# Patient Record
Sex: Female | Born: 1965 | Race: Black or African American | Hispanic: No | Marital: Single | Smoking: Current every day smoker
Health system: Southern US, Community
[De-identification: ages and names within clinical notes are randomized; demographics above are authoritative.]

## PROBLEM LIST (undated history)

## (undated) DIAGNOSIS — I10 Essential (primary) hypertension: Secondary | ICD-10-CM

## (undated) DIAGNOSIS — K589 Irritable bowel syndrome without diarrhea: Secondary | ICD-10-CM

## (undated) DIAGNOSIS — N920 Excessive and frequent menstruation with regular cycle: Secondary | ICD-10-CM

## (undated) DIAGNOSIS — F8081 Childhood onset fluency disorder: Secondary | ICD-10-CM

## (undated) DIAGNOSIS — F17200 Nicotine dependence, unspecified, uncomplicated: Secondary | ICD-10-CM

## (undated) DIAGNOSIS — N946 Dysmenorrhea, unspecified: Secondary | ICD-10-CM

## (undated) DIAGNOSIS — Z8619 Personal history of other infectious and parasitic diseases: Secondary | ICD-10-CM

## (undated) DIAGNOSIS — M1990 Unspecified osteoarthritis, unspecified site: Secondary | ICD-10-CM

## (undated) HISTORY — PX: ENDOMETRIAL ABLATION: SHX621

## (undated) HISTORY — DX: Unspecified osteoarthritis, unspecified site: M19.90

## (undated) HISTORY — PX: DILATION AND CURETTAGE OF UTERUS: SHX78

## (undated) HISTORY — DX: Childhood onset fluency disorder: F80.81

## (undated) HISTORY — PX: TUBAL LIGATION: SHX77

## (undated) HISTORY — DX: Personal history of other infectious and parasitic diseases: Z86.19

## (undated) HISTORY — PX: APPENDECTOMY: SHX54

## (undated) HISTORY — PX: OTHER SURGICAL HISTORY: SHX169

## (undated) HISTORY — DX: Irritable bowel syndrome, unspecified: K58.9

## (undated) HISTORY — DX: Dysmenorrhea, unspecified: N94.6

## (undated) HISTORY — DX: Excessive and frequent menstruation with regular cycle: N92.0

## (undated) HISTORY — DX: Nicotine dependence, unspecified, uncomplicated: F17.200

---

## 1998-05-23 ENCOUNTER — Other Ambulatory Visit: Admission: RE | Admit: 1998-05-23 | Discharge: 1998-05-23 | Payer: Self-pay | Admitting: Obstetrics

## 2000-07-02 ENCOUNTER — Ambulatory Visit (HOSPITAL_COMMUNITY): Admission: RE | Admit: 2000-07-02 | Discharge: 2000-07-02 | Payer: Self-pay | Admitting: Family Medicine

## 2001-08-31 ENCOUNTER — Encounter: Payer: Self-pay | Admitting: Family Medicine

## 2001-08-31 ENCOUNTER — Encounter: Admission: RE | Admit: 2001-08-31 | Discharge: 2001-08-31 | Payer: Self-pay | Admitting: Family Medicine

## 2002-09-01 ENCOUNTER — Encounter: Admission: RE | Admit: 2002-09-01 | Discharge: 2002-09-01 | Payer: Self-pay | Admitting: Family Medicine

## 2002-09-01 ENCOUNTER — Encounter: Payer: Self-pay | Admitting: Family Medicine

## 2003-09-18 ENCOUNTER — Other Ambulatory Visit: Admission: RE | Admit: 2003-09-18 | Discharge: 2003-09-18 | Payer: Self-pay | Admitting: Family Medicine

## 2003-09-29 ENCOUNTER — Emergency Department (HOSPITAL_COMMUNITY): Admission: EM | Admit: 2003-09-29 | Discharge: 2003-09-29 | Payer: Self-pay | Admitting: Emergency Medicine

## 2004-09-18 ENCOUNTER — Encounter: Admission: RE | Admit: 2004-09-18 | Discharge: 2004-09-18 | Payer: Self-pay | Admitting: Family Medicine

## 2004-09-18 ENCOUNTER — Other Ambulatory Visit: Admission: RE | Admit: 2004-09-18 | Discharge: 2004-09-18 | Payer: Self-pay | Admitting: Family Medicine

## 2004-11-21 ENCOUNTER — Encounter: Admission: RE | Admit: 2004-11-21 | Discharge: 2004-11-21 | Payer: Self-pay | Admitting: Occupational Medicine

## 2005-09-19 ENCOUNTER — Other Ambulatory Visit: Admission: RE | Admit: 2005-09-19 | Discharge: 2005-09-19 | Payer: Self-pay | Admitting: Family Medicine

## 2006-10-02 ENCOUNTER — Ambulatory Visit (HOSPITAL_COMMUNITY): Admission: RE | Admit: 2006-10-02 | Discharge: 2006-10-02 | Payer: Self-pay | Admitting: Family Medicine

## 2010-04-04 ENCOUNTER — Inpatient Hospital Stay (HOSPITAL_COMMUNITY): Admission: EM | Admit: 2010-04-04 | Discharge: 2010-04-05 | Payer: Self-pay | Source: Home / Self Care

## 2010-04-05 ENCOUNTER — Encounter (INDEPENDENT_AMBULATORY_CARE_PROVIDER_SITE_OTHER): Payer: Self-pay | Admitting: Internal Medicine

## 2010-06-17 LAB — CBC
HCT: 34.8 % — ABNORMAL LOW (ref 36.0–46.0)
HCT: 36.4 % (ref 36.0–46.0)
HCT: 36.7 % (ref 36.0–46.0)
HCT: 38.9 % (ref 36.0–46.0)
Hemoglobin: 12.2 g/dL (ref 12.0–15.0)
Hemoglobin: 12.5 g/dL (ref 12.0–15.0)
Hemoglobin: 12.7 g/dL (ref 12.0–15.0)
Hemoglobin: 13.4 g/dL (ref 12.0–15.0)
MCH: 31.3 pg (ref 26.0–34.0)
MCH: 31.5 pg (ref 26.0–34.0)
MCH: 31.5 pg (ref 26.0–34.0)
MCH: 31.9 pg (ref 26.0–34.0)
MCHC: 34.3 g/dL (ref 30.0–36.0)
MCHC: 34.4 g/dL (ref 30.0–36.0)
MCHC: 34.6 g/dL (ref 30.0–36.0)
MCHC: 35.1 g/dL (ref 30.0–36.0)
MCV: 91.1 fL (ref 78.0–100.0)
MCV: 91.1 fL (ref 78.0–100.0)
MCV: 91.2 fL (ref 78.0–100.0)
MCV: 91.3 fL (ref 78.0–100.0)
Platelets: 167 10*3/uL (ref 150–400)
Platelets: 169 10*3/uL (ref 150–400)
Platelets: 171 10*3/uL (ref 150–400)
Platelets: 173 10*3/uL (ref 150–400)
RBC: 3.82 MIL/uL — ABNORMAL LOW (ref 3.87–5.11)
RBC: 3.99 MIL/uL (ref 3.87–5.11)
RBC: 4.03 MIL/uL (ref 3.87–5.11)
RBC: 4.26 MIL/uL (ref 3.87–5.11)
RDW: 12.6 % (ref 11.5–15.5)
RDW: 12.6 % (ref 11.5–15.5)
RDW: 12.7 % (ref 11.5–15.5)
RDW: 12.7 % (ref 11.5–15.5)
WBC: 5 10*3/uL (ref 4.0–10.5)
WBC: 5.1 10*3/uL (ref 4.0–10.5)
WBC: 5.1 10*3/uL (ref 4.0–10.5)
WBC: 5.5 10*3/uL (ref 4.0–10.5)

## 2010-06-17 LAB — DIFFERENTIAL
Basophils Absolute: 0 10*3/uL (ref 0.0–0.1)
Basophils Absolute: 0 10*3/uL (ref 0.0–0.1)
Basophils Absolute: 0 10*3/uL (ref 0.0–0.1)
Basophils Absolute: 0 10*3/uL (ref 0.0–0.1)
Basophils Relative: 0 % (ref 0–1)
Basophils Relative: 1 % (ref 0–1)
Basophils Relative: 1 % (ref 0–1)
Basophils Relative: 1 % (ref 0–1)
Eosinophils Absolute: 0 10*3/uL (ref 0.0–0.7)
Eosinophils Absolute: 0.1 10*3/uL (ref 0.0–0.7)
Eosinophils Absolute: 0.1 10*3/uL (ref 0.0–0.7)
Eosinophils Absolute: 0.1 10*3/uL (ref 0.0–0.7)
Eosinophils Relative: 1 % (ref 0–5)
Eosinophils Relative: 1 % (ref 0–5)
Eosinophils Relative: 1 % (ref 0–5)
Eosinophils Relative: 2 % (ref 0–5)
Lymphocytes Relative: 33 % (ref 12–46)
Lymphocytes Relative: 35 % (ref 12–46)
Lymphocytes Relative: 35 % (ref 12–46)
Lymphocytes Relative: 36 % (ref 12–46)
Lymphs Abs: 1.7 10*3/uL (ref 0.7–4.0)
Lymphs Abs: 1.7 10*3/uL (ref 0.7–4.0)
Lymphs Abs: 1.8 10*3/uL (ref 0.7–4.0)
Lymphs Abs: 1.9 10*3/uL (ref 0.7–4.0)
Monocytes Absolute: 0.3 10*3/uL (ref 0.1–1.0)
Monocytes Absolute: 0.3 10*3/uL (ref 0.1–1.0)
Monocytes Absolute: 0.4 10*3/uL (ref 0.1–1.0)
Monocytes Absolute: 0.4 10*3/uL (ref 0.1–1.0)
Monocytes Relative: 6 % (ref 3–12)
Monocytes Relative: 7 % (ref 3–12)
Monocytes Relative: 7 % (ref 3–12)
Monocytes Relative: 7 % (ref 3–12)
Neutro Abs: 2.8 10*3/uL (ref 1.7–7.7)
Neutro Abs: 2.9 10*3/uL (ref 1.7–7.7)
Neutro Abs: 3 10*3/uL (ref 1.7–7.7)
Neutro Abs: 3.1 10*3/uL (ref 1.7–7.7)
Neutrophils Relative %: 56 % (ref 43–77)
Neutrophils Relative %: 56 % (ref 43–77)
Neutrophils Relative %: 58 % (ref 43–77)
Neutrophils Relative %: 59 % (ref 43–77)

## 2010-06-17 LAB — MRSA PCR SCREENING: MRSA by PCR: NEGATIVE

## 2010-06-17 LAB — COMPREHENSIVE METABOLIC PANEL
ALT: 21 U/L (ref 0–35)
AST: 24 U/L (ref 0–37)
Albumin: 3.6 g/dL (ref 3.5–5.2)
Alkaline Phosphatase: 51 U/L (ref 39–117)
BUN: 6 mg/dL (ref 6–23)
CO2: 26 mEq/L (ref 19–32)
Calcium: 8.8 mg/dL (ref 8.4–10.5)
Chloride: 108 mEq/L (ref 96–112)
Creatinine, Ser: 0.72 mg/dL (ref 0.4–1.2)
GFR calc Af Amer: >60 mL/min (ref >60)
GFR calc non Af Amer: >60 mL/min (ref >60)
Glucose, Bld: 90 mg/dL (ref 70–99)
Potassium: 3.8 mEq/L (ref 3.5–5.1)
Sodium: 139 mEq/L (ref 135–145)
Total Bilirubin: 0.3 mg/dL (ref 0.3–1.2)
Total Protein: 6 g/dL (ref 6.0–8.3)

## 2010-06-17 LAB — APTT: aPTT: 30 seconds (ref 24–37)

## 2010-06-17 LAB — BRAIN NATRIURETIC PEPTIDE: Pro B Natriuretic peptide (BNP): 37.4 pg/mL (ref 0.0–100.0)

## 2010-06-17 LAB — TYPE AND SCREEN
ABO/RH(D): A POS
Antibody Screen: NEGATIVE

## 2010-06-17 LAB — PROTIME-INR
INR: 1.05 (ref 0.00–1.49)
Prothrombin Time: 13.9 seconds (ref 11.6–15.2)

## 2010-06-17 LAB — TSH: TSH: 1.969 u[IU]/mL (ref 0.350–4.500)

## 2010-08-08 ENCOUNTER — Other Ambulatory Visit: Payer: Self-pay | Admitting: Obstetrics & Gynecology

## 2010-08-08 ENCOUNTER — Other Ambulatory Visit (HOSPITAL_COMMUNITY)
Admission: RE | Admit: 2010-08-08 | Discharge: 2010-08-08 | Disposition: A | Discharge status: Home / Self Care | Payer: BC Managed Care – PPO | Source: Ambulatory Visit | Attending: Obstetrics & Gynecology | Admitting: Obstetrics & Gynecology

## 2010-08-08 DIAGNOSIS — Z139 Encounter for screening, unspecified: Secondary | ICD-10-CM

## 2010-08-08 DIAGNOSIS — Z01419 Encounter for gynecological examination (general) (routine) without abnormal findings: Secondary | ICD-10-CM | POA: Insufficient documentation

## 2010-08-13 ENCOUNTER — Ambulatory Visit (HOSPITAL_COMMUNITY)
Admission: RE | Admit: 2010-08-13 | Discharge: 2010-08-13 | Disposition: A | Discharge status: Home / Self Care | Payer: BC Managed Care – PPO | Source: Ambulatory Visit | Attending: Obstetrics & Gynecology | Admitting: Obstetrics & Gynecology

## 2010-08-13 DIAGNOSIS — Z1231 Encounter for screening mammogram for malignant neoplasm of breast: Secondary | ICD-10-CM | POA: Insufficient documentation

## 2010-08-13 DIAGNOSIS — Z139 Encounter for screening, unspecified: Secondary | ICD-10-CM

## 2010-09-20 ENCOUNTER — Encounter (HOSPITAL_COMMUNITY): Payer: BC Managed Care – PPO

## 2010-09-20 ENCOUNTER — Other Ambulatory Visit: Payer: Self-pay | Admitting: Infectious Diseases

## 2010-09-20 ENCOUNTER — Other Ambulatory Visit: Payer: Self-pay | Admitting: Obstetrics & Gynecology

## 2010-09-20 LAB — COMPREHENSIVE METABOLIC PANEL
ALT: 12 U/L (ref 0–35)
AST: 16 U/L (ref 0–37)
Albumin: 4.3 g/dL (ref 3.5–5.2)
Alkaline Phosphatase: 63 U/L (ref 39–117)
BUN: 12 mg/dL (ref 6–23)
CO2: 25 mEq/L (ref 19–32)
Calcium: 10.2 mg/dL (ref 8.4–10.5)
Chloride: 107 mEq/L (ref 96–112)
Creatinine, Ser: 0.93 mg/dL (ref 0.50–1.10)
GFR calc Af Amer: >60 mL/min (ref >60)
GFR calc non Af Amer: >60 mL/min (ref >60)
Glucose, Bld: 105 mg/dL — ABNORMAL HIGH (ref 70–99)
Potassium: 4.3 mEq/L (ref 3.5–5.1)
Sodium: 140 mEq/L (ref 135–145)
Total Bilirubin: 0.7 mg/dL (ref 0.3–1.2)
Total Protein: 7.3 g/dL (ref 6.0–8.3)

## 2010-09-20 LAB — CBC
HCT: 42.8 % (ref 36.0–46.0)
Hemoglobin: 14.5 g/dL (ref 12.0–15.0)
MCH: 31.6 pg (ref 26.0–34.0)
MCHC: 33.9 g/dL (ref 30.0–36.0)
MCV: 93.2 fL (ref 78.0–100.0)
Platelets: 196 10*3/uL (ref 150–400)
RBC: 4.59 MIL/uL (ref 3.87–5.11)
RDW: 12.7 % (ref 11.5–15.5)
WBC: 3.9 10*3/uL — ABNORMAL LOW (ref 4.0–10.5)

## 2010-09-20 LAB — URINE MICROSCOPIC-ADD ON

## 2010-09-20 LAB — URINALYSIS, ROUTINE W REFLEX MICROSCOPIC
Bilirubin Urine: NEGATIVE
Glucose, UA: NEGATIVE mg/dL
Ketones, ur: NEGATIVE mg/dL
Leukocytes, UA: NEGATIVE
Nitrite: NEGATIVE
Protein, ur: NEGATIVE mg/dL
Specific Gravity, Urine: >1.03 — ABNORMAL HIGH (ref 1.005–1.030)
Urobilinogen, UA: 0.2 mg/dL (ref 0.0–1.0)
pH: 6 (ref 5.0–8.0)

## 2010-09-20 LAB — SURGICAL PCR SCREEN: Staphylococcus aureus: NEGATIVE

## 2010-09-20 LAB — HCG, QUANTITATIVE, PREGNANCY: hCG, Beta Chain, Quant, S: 1 m[IU]/mL (ref <5)

## 2010-09-27 ENCOUNTER — Other Ambulatory Visit: Payer: Self-pay | Admitting: Obstetrics & Gynecology

## 2010-09-27 ENCOUNTER — Ambulatory Visit (HOSPITAL_COMMUNITY)
Admission: RE | Admit: 2010-09-27 | Discharge: 2010-09-27 | Disposition: A | Discharge status: Home / Self Care | Payer: BC Managed Care – PPO | Source: Ambulatory Visit | Attending: Obstetrics & Gynecology | Admitting: Obstetrics & Gynecology

## 2010-09-27 DIAGNOSIS — N946 Dysmenorrhea, unspecified: Secondary | ICD-10-CM | POA: Insufficient documentation

## 2010-09-27 DIAGNOSIS — Z01812 Encounter for preprocedural laboratory examination: Secondary | ICD-10-CM | POA: Insufficient documentation

## 2010-09-27 DIAGNOSIS — N920 Excessive and frequent menstruation with regular cycle: Secondary | ICD-10-CM | POA: Insufficient documentation

## 2010-10-11 NOTE — Op Note (Signed)
  [name], [name]                ACCOUNT NO.:  [id number]  MEDICAL RECORD NO.:  [id number]  LOCATION:  [facility]                          FACILITY:  [facility]  PHYSICIAN:  Lazaro Arms, M.D.   DATE OF BIRTH:  1966/03/16  DATE OF PROCEDURE:  09/27/2010 DATE OF DISCHARGE:  09/27/2010                              OPERATIVE REPORT   PREOPERATIVE DIAGNOSES: 1. Menometrorrhagia. 2. Dysmenorrhea.  POSTOPERATIVE DIAGNOSES: 1. Menometrorrhagia. 2. Dysmenorrhea.  PROCEDURES:  Hysteroscopy, dilation and curettage, and endometrial ablation.  SURGEON:  Lazaro Arms, MD  ANESTHESIA:  General endotracheal.  FINDINGS:  The patient had normal endometrium.  No polyps, fibroids or other abnormalities.  DESCRIPTION OF OPERATION:  The patient was taken to the operating room, placed in supine position, underwent general tracheal anesthesia, placed in dorsal lithotomy position, prepped and draped in usual sterile fashion.  Graves speculum was placed.  Cervix was grasped and dilated serially to allow passage of the hysteroscope.  Diagnostic hysteroscopy was performed and found to be normal.  A vigorous uterine curettage was then performed, specimen sent to Pathology.  ThermaChoice III endometrial ablation balloon was used, approximately 30 mL of D5W was required to maintain a pressure between 190 and 200 mmHg throughout the procedure.  Total therapy time was 9.5 minutes.  The patient tolerated the procedure well.  All the equipment worked properly and all the fluid was returned at the end of the procedure.  The patient was awakened from anesthesia, taken to recovery room in good stable condition.  All counts were correct.  She received Ancef and Toradol prophylactically.    Lazaro Arms, M.D.    Loraine Maple  D:  10/09/2010  T:  10/10/2010  Job:  [id number]  Electronically Signed by Duane Lope M.D. on 10/11/2010 12:23:31 PM

## 2011-09-15 ENCOUNTER — Emergency Department (HOSPITAL_COMMUNITY)
Admission: EM | Admit: 2011-09-15 | Discharge: 2011-09-15 | Disposition: A | Discharge status: Home / Self Care | Payer: BC Managed Care – PPO | Attending: Emergency Medicine | Admitting: Emergency Medicine

## 2011-09-15 ENCOUNTER — Encounter (HOSPITAL_COMMUNITY): Payer: Self-pay | Admitting: *Deleted

## 2011-09-15 DIAGNOSIS — S30860A Insect bite (nonvenomous) of lower back and pelvis, initial encounter: Secondary | ICD-10-CM | POA: Insufficient documentation

## 2011-09-15 DIAGNOSIS — F17200 Nicotine dependence, unspecified, uncomplicated: Secondary | ICD-10-CM | POA: Insufficient documentation

## 2011-09-15 DIAGNOSIS — W57XXXA Bitten or stung by nonvenomous insect and other nonvenomous arthropods, initial encounter: Secondary | ICD-10-CM | POA: Insufficient documentation

## 2011-09-15 NOTE — ED Notes (Signed)
Removed a tick from back yesterday,  Today has nausea , no vomiting.

## 2011-09-15 NOTE — Discharge Instructions (Signed)
Wood Tick Bite Ticks are insects that attach themselves to the skin. Most tick bites are harmless, but sometimes ticks carry diseases that can make a person quite ill. The chance of getting ill depends on:  The kind of tick that bites you.   Time of year.   How long the tick is attached.   Geographic location.  Wood ticks are also called dog ticks. They are generally black. They can have white markings. They live in shrubs and grassy areas. They are larger than deer ticks. Wood ticks are about the size of a watermelon seed. They have a hard body. The most common places for ticks to attach themselves are the scalp, neck, armpits, waist, and groin. Wood ticks may stay attached for up to 2 weeks. TICKS MUST BE REMOVED AS SOON AS POSSIBLE TO HELP PREVENT DISEASES CAUSED BY TICK BITES.  TO REMOVE A TICK: 1. If available, put on latex gloves before trying to remove a tick.  2. Grasp the tick as close to the skin as possible, with curved forceps, fine tweezers or a special tick removal tool.  3. Pull gently with steady pressure until the tick lets go. Do not twist the tick or jerk it suddenly. This may break off the tick's head or mouth parts.  4. Do not crush the tick's body. This could force disease-carrying fluids from the tick into your body.  5. After the tick is removed, wash the bite area and your hands with soap and water or other disinfectant.  6. Apply a small amount of antiseptic cream or ointment to the bite site.  7. Wash and disinfect any instruments that were used.  8. Save the tick in a jar or plastic bag for later identification. Preserve the tick with a bit of alcohol or put it in the freezer.  9. Do not apply a hot match, petroleum jelly, or fingernail polish to the tick. This does not work and may increase the chances of disease from the tick bite.  YOU MAY NEED TO SEE YOUR CAREGIVER FOR A TETANUS SHOT NOW IF:  You have no idea when you had the last one.   You have never had a  tetanus shot before.  If you need a tetanus shot, and you decide not to get one, there is a rare chance of getting tetanus. Sickness from tetanus can be serious. If you get a tetanus shot, your arm may swell, get red and warm to the touch at the shot site. This is common and not a problem. PREVENTION  Wear protective clothing. Long sleeves and pants are best.   Wear white clothes to see ticks more easily   Tuck your pant legs into your socks.   If walking on trail, stay in the middle of the trail to avoid brushing against bushes.   Put insect repellent on all exposed skin and along boot tops, pant legs and sleeve cuffs   Check clothing, hair and skin repeatedly and before coming inside.   Brush off any ticks that are not attached.  SEEK MEDICAL CARE IF:   You cannot remove a tick or part of the tick that is left in the skin.   Unexplained fever.   Redness and swelling in the area of the tick bite.   Tender, swollen lymph glands.   Diarrhea.   Weight loss.   Cough.   Fatigue.   Muscle, joint or bone pain.   Belly pain.   Headache.   Rash.    SEEK IMMEDIATE MEDICAL CARE IF:   You develop an oral temperature above 102 F (38.9 C).   You are having trouble walking or moving your legs.   Numbness in the legs.   Shortness of breath.   Confusion.   Repeated vomiting.  Document Released: 03/21/2000 Document Revised: 03/13/2011 Document Reviewed: 02/28/2008 [facility] Patient Information 2012 [place], [place].  You may try Benadryl cream or, Calmoseptine  which are both excellent anti-itch creams.

## 2011-09-15 NOTE — ED Notes (Signed)
Discharge instructions reviewed with pt, questions answered. Pt verbalized understanding.  

## 2011-09-20 NOTE — ED Provider Notes (Signed)
History     CSN: [id number]  Arrival date & time 09/15/11  2039   First MD Initiated Contact with Patient 09/15/11 2120      Chief Complaint  Patient presents with  . Tick Removal    (Consider location/radiation/quality/duration/timing/severity/associated sxs/prior treatment) HPI Comments: Barbara [name] presents for evaluation of a tick bite site.  She had a non engorged tick on her mid back yesterday which was removed without incident using a tweezers. She woke today with mild nausea which has resolved,  But is concerned about this tick exposure.  She denies fevers,  Chills,  Rash,  Headache or any other complaint.  She has been able to tolerate PO intake without emesis,  She also denies abdominal pain or bowel changes.   The history is provided by the patient and a parent.    History reviewed. No pertinent past medical history.  Past Surgical History  Procedure Date  . Appendectomy   . Tubal ligation   . Lt foot surgery   . Endometrial ablation     History reviewed. No pertinent family history.  History  Substance Use Topics  . Smoking status: Current Everyday Smoker  . Smokeless tobacco: Not on file  . Alcohol Use: No    OB History    Grav Para Term Preterm Abortions TAB SAB Ect Mult Living                  Review of Systems  Constitutional: Negative for fever.  HENT: Negative for congestion, sore throat and neck pain.   Eyes: Negative.   Respiratory: Negative for chest tightness and shortness of breath.   Cardiovascular: Negative for chest pain.  Gastrointestinal: Negative for nausea and abdominal pain.  Genitourinary: Negative.   Musculoskeletal: Negative for joint swelling and arthralgias.  Skin: Negative.  Negative for rash and wound.  Neurological: Negative for dizziness, weakness, light-headedness, numbness and headaches.  Hematological: Negative.   Psychiatric/Behavioral: Negative.     Allergies  Penicillins  Home Medications  No current  outpatient prescriptions on file.  BP 141/83  Pulse 76  Temp 98.1 F (36.7 C) (Oral)  Resp 16  Ht 5\' 11"  (1.803 m)  Wt 169 lb (76.658 kg)  BMI 23.57 kg/m2  SpO2 99%  Physical Exam  Nursing note and vitals reviewed. Constitutional: She appears well-developed and well-nourished.  HENT:  Head: Normocephalic and atraumatic.  Eyes: Pupils are equal, round, and reactive to light.  Neck: Normal range of motion. Neck supple.  Cardiovascular: Normal rate, regular rhythm, normal heart sounds and intact distal pulses.   Pulmonary/Chest: Effort normal and breath sounds normal. No respiratory distress.  Abdominal: Soft. Bowel sounds are normal. She exhibits no distension. There is no tenderness.  Musculoskeletal: Normal range of motion.  Lymphadenopathy:    She has no axillary adenopathy.  Neurological: She is alert.  Skin: Skin is warm and dry. No rash noted.       Tiny pinpoint red macular lesion midback at bite site without surrounding erythema,  No edema or induration.   Psychiatric: She has a normal mood and affect.    ED Course  Procedures (including critical care time)  Labs Reviewed - No data to display No results found.   1. Tick bite       MDM  Reassurance given,  But cautioned to get rechecked for any development of fever,  Headaches,  Rash,  Myalgias or joint pain/swelling.  Pt and mother understand plan.  The patient appears reasonably  screened and/or stabilized for discharge and I doubt any other medical condition or other [facility] requiring further screening, evaluation, or treatment in the ED at this time prior to discharge.         Burgess Amor, [place] 09/20/11 2243

## 2011-09-23 NOTE — ED Provider Notes (Signed)
Medical screening examination/treatment/procedure(s) were performed by non-physician practitioner and as supervising physician I was immediately available for consultation/collaboration. Brenly Trawick, MD, FACEP   Grayton Lobo L Aylyn Wenzler, MD 09/23/11 1304 

## 2012-06-25 ENCOUNTER — Encounter: Payer: Self-pay | Admitting: Hematology

## 2012-06-28 ENCOUNTER — Ambulatory Visit (INDEPENDENT_AMBULATORY_CARE_PROVIDER_SITE_OTHER): Payer: BC Managed Care – PPO | Admitting: Obstetrics and Gynecology

## 2012-06-28 ENCOUNTER — Encounter: Payer: Self-pay | Admitting: Obstetrics and Gynecology

## 2012-06-28 VITALS — BP 124/80 | Wt 173.0 lb

## 2012-06-28 DIAGNOSIS — D251 Intramural leiomyoma of uterus: Secondary | ICD-10-CM

## 2012-06-28 NOTE — Patient Instructions (Addendum)
Uterine Fibroid A uterine fibroid is a growth (tumor) that occurs in a woman's uterus. This type of tumor is not cancerous and does not spread out of the uterus. A woman can have one or many fibroids, and the fiboid(s) can become quite large. A fibroid can vary in size, weight, and where it grows in the uterus. Most fibroids do not require medical treatment, but some can cause pain or heavy bleeding during and between periods. CAUSES  A fibroid is the result of a single uterine cell that keeps growing (unregulated), which is different than most cells in the human body. Most cells have a control mechanism that keeps them from reproducing without control.  SYMPTOMS   Bleeding.  Pelvic pain and pressure.  Bladder problems due to the size of the fibroid.  Infertility and miscarriages depending on the size and location of the fibroid. DIAGNOSIS  A diagnosis is made by physical exam. Your caregiver may feel the lumpy tumors during a pelvic exam. Important information regarding size, location, and number of tumors can be gained by having an ultrasound. It is rare that other tests, such as a CT scan or MRI, are needed. TREATMENT   Your caregiver may recommend watchful waiting. This involves getting the fibroid checked by your caregiver to see if the fibroids grow or shrink.   Hormonal treatment or an intrauterine device (IUD) may be prescribed.   Surgery may be needed to remove the fibroids (myomectomy) or the uterus (hysterectomy). This depends on your situation. When fibroids interfere with fertility and a woman wants to become pregnant, a caregiver may recommend having the fibroids removed.  HOME CARE INSTRUCTIONS  Home care depends on how you were treated. In general:   Keep all follow-up appointments with your caregiver.   Only take medicine as told by your caregiver. Do not take aspirin. It can cause bleeding.   If you have excessive periods and soak tampons or pads in a half hour or  less, contact your caregiver immediately. If your periods are troublesome but not so heavy, lie down with your feet raised slightly above your heart. Place cold packs on your lower abdomen.   If your periods are heavy, write down the number of pads or tampons you use per month. Bring this information to your caregiver.   Talk to your caregiver about taking iron pills.   Include green vegetables in your diet.   If you were prescribed a hormonal treatment, take the hormonal medicines as directed.   If you need surgery, ask your caregiver for information on your specific surgery.  SEEK IMMEDIATE MEDICAL CARE IF:  You have pelvic pain or cramps not controlled with medicines.   You have a sudden increase in pelvic pain.   You have an increase of bleeding between and during periods.   You feel lightheaded or have fainting episodes.  MAKE SURE YOU:  Understand these instructions.  Will watch your condition.  Will get help right away if you are not doing well or get worse. Document Released: 03/21/2000 Document Revised: 06/16/2011 Document Reviewed: 04/14/2011 [facility] Patient Information 2013 [place], [place].  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    My nurse Kennon Rounds will call you with the date of your surgery.  We will shoot for the end of April or beginning of May.

## 2012-06-28 NOTE — Progress Notes (Signed)
47 y.o.SingleAfrican American [id number] female here for consideration for robotically assisted TLH  with Bilateral salpingectomy and without removal of ovaries.    She complains of uterine leiomyomata/fibroids, menorrhagia and metrorrhagia. PUS on June 15, 2012 showed the uterus to be enlarged and contain 3 fibroids, the largest being about 5 cm.  Adnexa Normal.  Endometrial biopsy on June 15, 2012 was Normal.        The current method of family planning is tubal ligation.     Hormones:none  Because of patient's heavy bleeding history, she was screened for Von Willebrands disease, and her factor VIII was low at 49 with nl ristocetin cofactor and vonW factor.  TSH, PT, PTT and plts were normal.  These levels are considered acceptable for surgical hemostasis in light of pt's desire for hysterectomy.   Barbara [name] reports that she has been smoking Cigarettes.  She has been smoking about 0.25 packs per day. She has never used smokeless tobacco. She reports that she uses illicit drugs (Marijuana). She reports that she does not drink alcohol.Patient works as a Therapist, occupational and lifts heavy loads.  She was told she would need to be out of work for 6 weeks.    Pt desires to have surgery around the end of April.  Mother will be her support person named [name], and patients two sons.    Health Maintenance  Topic Date Due  . Influenza Vaccine  Aug [birth year] 1966  . Tetanus/tdap  12/23/1985  . Pap Smear  01/14/2014    Family History  Problem Relation Age of Onset  . Hypertension Mother   . Osteoporosis Mother   . Osteoporosis Maternal Grandmother   . Hypertension Maternal Grandmother   . Heart disease Maternal Grandmother   . Cancer Father     stomach  . Diabetes Maternal Grandfather   . Osteoporosis Maternal Grandfather     There is no problem list on file for this patient.   Past Medical History  Diagnosis Date  . Menorrhagia   . IBS (irritable bowel syndrome)   . Arthritis     hands,  knees, lower back  . History of chlamydia   . Stuttering     Past Surgical History  Procedure Laterality Date  . Appendectomy    . Tubal ligation    . Lt foot surgery    . Endometrial ablation    . Dilation and curettage of uterus      Allergies:  PCN causes hives   Current Outpatient Prescriptions  Medication Sig Dispense Refill  . acetaminophen (TYLENOL) 325 MG tablet Take 650 mg by mouth every 6 (six) hours as needed for pain.      . cetirizine (ZYRTEC) 10 MG tablet Take 10 mg by mouth as needed for allergies.       No current facility-administered medications for this visit.      A/P:  The planned procedure was discussed with the patient.  We will plan a R-TLH with Bilateral salpingectomy.  Procedure, hospital stay, post op recovery all discussed.  Also discussed that she is "borderline" for vonWillebrands and her physicians need to know she has this disorder.  Pt would like to proceed with surgery about the end of April.  Will schedule.  Then will need pre-op visit soon before surgery.   Over 25 min with pt face to face in counseling.

## 2012-07-01 ENCOUNTER — Telehealth: Payer: Self-pay | Admitting: Obstetrics and Gynecology

## 2012-07-01 NOTE — Telephone Encounter (Signed)
Please call patient with surgery date.

## 2012-07-02 NOTE — Telephone Encounter (Signed)
Spoke with patient regarding date preferences. Requests first available in May.  She requests FMLA paperwork be completed by 07-08-12.  Advised that can not complete forms until surgical date scheduled and need 2 weeks to complete.  Date to be scheduled once insurance dept has had a chance to verify beneifts and OOP cost and patient agrees to cost.  Location manager in insurance will call her next week and then I will schedule date.

## 2012-07-05 ENCOUNTER — Telehealth: Payer: Self-pay | Admitting: Obstetrics and Gynecology

## 2012-07-05 NOTE — Telephone Encounter (Signed)
Sp w/ pt to advise that OOP cost for surgery (R-TLH, cystoscopy) is $3,508.30 (pt has a $3500 CYD ($0 met to date)).  Pt was shocked at the cost of surgery and said she would have to think about it and call us back.

## 2012-08-23 ENCOUNTER — Telehealth: Payer: Self-pay | Admitting: *Deleted

## 2012-08-23 NOTE — Telephone Encounter (Signed)
[name] to see what she has decided regarding surgery. Spoke to female at home number who states patient is not there. Asked if she could call me back with update.

## 2012-09-29 ENCOUNTER — Telehealth: Payer: Self-pay | Admitting: *Deleted

## 2012-09-29 NOTE — Telephone Encounter (Signed)
Follow up call to patient regarding surgery options.  Per Dr Tresa Res, offer referral to GYN clinic if cost is the reason she is declining surgery.LMTCB

## 2012-09-30 ENCOUNTER — Telehealth: Payer: Self-pay | Admitting: *Deleted

## 2012-09-30 NOTE — Telephone Encounter (Signed)
Follow up call to patient to offer referral to [facility] for surgery.  Patient states she is no longer employed and has no insurance.  I explained that the clinic hs a sliding scale. Patient does not live in [place] so would actually need referral to clinic in her county of residence and I offered to help patient with this.  Patient declines and states she really is not interested in surgery right now.  Instructed her to call us if she decides she would like referral. Patient agrees.   Paper chart ( Pre-EPIC) to your office. Ok to file?

## 2012-10-04 NOTE — Telephone Encounter (Signed)
Yes, ok to file.

## 2012-10-13 NOTE — Telephone Encounter (Signed)
See next phone note.

## 2014-02-06 ENCOUNTER — Encounter: Payer: Self-pay | Admitting: Obstetrics and Gynecology

## 2014-12-07 ENCOUNTER — Encounter: Payer: Self-pay | Admitting: Obstetrics and Gynecology

## 2014-12-07 ENCOUNTER — Ambulatory Visit (INDEPENDENT_AMBULATORY_CARE_PROVIDER_SITE_OTHER): Payer: BLUE CROSS/BLUE SHIELD | Admitting: Obstetrics and Gynecology

## 2014-12-07 VITALS — BP 128/88 | HR 84 | Resp 14 | Ht 69.5 in | Wt 169.0 lb

## 2014-12-07 DIAGNOSIS — Z0189 Encounter for other specified special examinations: Secondary | ICD-10-CM

## 2014-12-07 DIAGNOSIS — R519 Headache, unspecified: Secondary | ICD-10-CM

## 2014-12-07 DIAGNOSIS — N920 Excessive and frequent menstruation with regular cycle: Secondary | ICD-10-CM | POA: Diagnosis not present

## 2014-12-07 DIAGNOSIS — N923 Ovulation bleeding: Secondary | ICD-10-CM

## 2014-12-07 DIAGNOSIS — Z0000 Encounter for general adult medical examination without abnormal findings: Secondary | ICD-10-CM | POA: Diagnosis not present

## 2014-12-07 DIAGNOSIS — Z9289 Personal history of other medical treatment: Secondary | ICD-10-CM

## 2014-12-07 DIAGNOSIS — N946 Dysmenorrhea, unspecified: Secondary | ICD-10-CM

## 2014-12-07 DIAGNOSIS — R51 Headache: Secondary | ICD-10-CM

## 2014-12-07 DIAGNOSIS — Z01419 Encounter for gynecological examination (general) (routine) without abnormal findings: Secondary | ICD-10-CM

## 2014-12-07 DIAGNOSIS — Z124 Encounter for screening for malignant neoplasm of cervix: Secondary | ICD-10-CM

## 2014-12-07 DIAGNOSIS — Z113 Encounter for screening for infections with a predominantly sexual mode of transmission: Secondary | ICD-10-CM | POA: Diagnosis not present

## 2014-12-07 DIAGNOSIS — Z23 Encounter for immunization: Secondary | ICD-10-CM | POA: Diagnosis not present

## 2014-12-07 DIAGNOSIS — Z2839 Other underimmunization status: Secondary | ICD-10-CM

## 2014-12-07 DIAGNOSIS — N939 Abnormal uterine and vaginal bleeding, unspecified: Secondary | ICD-10-CM

## 2014-12-07 DIAGNOSIS — Z9189 Other specified personal risk factors, not elsewhere classified: Secondary | ICD-10-CM

## 2014-12-07 LAB — COMPREHENSIVE METABOLIC PANEL
ALK PHOS: 54 U/L (ref 33–115)
ALT: 9 U/L (ref 6–29)
AST: 15 U/L (ref 10–35)
Albumin: 4.8 g/dL (ref 3.6–5.1)
BUN: 10 mg/dL (ref 7–25)
CO2: 25 mmol/L (ref 20–31)
CREATININE: 0.86 mg/dL (ref 0.50–1.10)
Calcium: 9.6 mg/dL (ref 8.6–10.2)
Chloride: 107 mmol/L (ref 98–110)
GLUCOSE: 84 mg/dL (ref 65–99)
Potassium: 4.2 mmol/L (ref 3.5–5.3)
SODIUM: 142 mmol/L (ref 135–146)
TOTAL PROTEIN: 7.3 g/dL (ref 6.1–8.1)
Total Bilirubin: 0.7 mg/dL (ref 0.2–1.2)

## 2014-12-07 LAB — LIPID PANEL
CHOL/HDL RATIO: 2.6 ratio (ref <=5.0)
Cholesterol: 133 mg/dL (ref 125–200)
HDL: 52 mg/dL (ref >=46)
LDL CALC: 73 mg/dL (ref <130)
Triglycerides: 42 mg/dL (ref <150)
VLDL: 8 mg/dL (ref <30)

## 2014-12-07 LAB — TSH: TSH: 0.665 u[IU]/mL (ref 0.350–4.500)

## 2014-12-07 LAB — CBC
HCT: 41.1 % (ref 36.0–46.0)
Hemoglobin: 14 g/dL (ref 12.0–15.0)
MCH: 31.5 pg (ref 26.0–34.0)
MCHC: 34.1 g/dL (ref 30.0–36.0)
MCV: 92.6 fL (ref 78.0–100.0)
MPV: 9.4 fL (ref 8.6–12.4)
PLATELETS: 245 10*3/uL (ref 150–400)
RBC: 4.44 MIL/uL (ref 3.87–5.11)
RDW: 13.1 % (ref 11.5–15.5)
WBC: 6.2 10*3/uL (ref 4.0–10.5)

## 2014-12-07 LAB — FERRITIN: FERRITIN: 49 ng/mL (ref 10–291)

## 2014-12-07 NOTE — Patient Instructions (Signed)

## 2014-12-07 NOTE — Progress Notes (Signed)
Patient ID: Barbara [name], female   DOB: 12-13-1965, 49 y.o.   MRN: [id number] 49 y.o. [id number] SingleAfrican AmericanF here for annual exam.  The patint has a h/o a fibroid uterus and abnormal bleeding. She had an endometrial ablation in 2012. Menses currently coming every 3-4 weeks x 7-10 days. Saturating a pad in about an hour. Passing large blood clots. Random intermenstrual spotting in the last few months. She is chronically tired. She c/o hot flashes for years, nightsweats, no change. She c/o headaches with her cycles. Sexually active, long term partner, desires STD testing.  Period Duration (Days): 7-10 days  Period Pattern: (!) Irregular Menstrual Flow: Heavy Menstrual Control: Maxi pad Dysmenorrhea: (!) Severe Dysmenorrhea Symptoms: Cramping   Prior evaluation revealed the following: PUS on June 15, 2012 showed the uterus to be enlarged and contain 3 fibroids, the largest being about 5 cm. Adnexa Normal. Endometrial biopsy on June 15, 2012 was Normal.   Because of patient's heavy bleeding history, she was screened for Von Willebrands disease, and her factor VIII was low at 49 with nl ristocetin cofactor and vonW factor. TSH, PT, PTT and plts were normal. These levels are considered acceptable for surgical hemostasis  Patient's last menstrual period was 11/26/2014.          Sexually active: Yes.    The current method of family planning is tubal ligation  Exercising: No.  The patient does not participate in regular exercise at present. Smoker:  Yes, 5 cigarettes a day  Health Maintenance: Pap:  06-01-12 WNL History of abnormal Pap:  Yes, no surgery on her cervix. MMG:  08-14-10 WNL Colonoscopy:  2012 polyps repeat in 5 years BMD:   Never TDaP:  unsureTDAPGardasil: N/A   reports that she has been smoking Cigarettes.  She has been smoking about 0.25 packs per day. She has never used smokeless tobacco. She reports that she does not drink alcohol or use illicit drugs.  Past  Medical History  Diagnosis Date  . Menorrhagia   . IBS (irritable bowel syndrome)   . Arthritis     hands, knees, lower back  . History of chlamydia   . Stuttering     Past Surgical History  Procedure Laterality Date  . Appendectomy    . Tubal ligation    . Lt foot surgery    . Endometrial ablation    . Dilation and curettage of uterus      No current outpatient prescriptions on file.   No current facility-administered medications for this visit.    Family History  Problem Relation Age of Onset  . Hypertension Mother   . Osteoporosis Mother   . Osteoporosis Maternal Grandmother   . Hypertension Maternal Grandmother   . Heart disease Maternal Grandmother   . Cancer Father     stomach  . Diabetes Maternal Grandfather   . Osteoporosis Maternal Grandfather     Review of Systems  Constitutional: Negative.   HENT: Negative.   Eyes: Negative.   Respiratory: Negative.   Cardiovascular: Negative.   Gastrointestinal: Negative.   Endocrine: Negative.   Genitourinary: Positive for menstrual problem.  Musculoskeletal: Negative.   Skin: Negative.   Allergic/Immunologic: Negative.   Neurological: Negative.   Psychiatric/Behavioral: Negative.    SH: daughter is expecting her first baby, lives with her. Son is local. Exam:   BP 128/88 mmHg  Pulse 84  Resp 14  Ht 5' 9.5" (1.765 m)  Wt 169 lb (76.658 kg)  BMI 24.61 kg/m2  LMP  11/26/2014  Weight change: @WEIGHTCHANGE @ Height:   Height: 5' 9.5" (176.5 cm)  Ht Readings from Last 3 Encounters:  12/07/14 5' 9.5" (1.765 m)  09/15/11 5\' 11"  (1.803 m)    General appearance: alert, cooperative and appears stated age Head: Normocephalic, without obvious abnormality, atraumatic Neck: no adenopathy, supple, symmetrical, trachea midline and thyroid normal to inspection and palpation Lungs: clear to auscultation bilaterally Breasts: normal appearance, no masses or tenderness Heart: regular rate and rhythm Abdomen: soft,  non-tender; bowel sounds normal; uterus palpated in the suprapubic region, not tender.  Extremities: extremities normal, atraumatic, no cyanosis or edema Skin: Skin color, texture, turgor normal. No rashes or lesions Lymph nodes: Cervical, supraclavicular, and axillary nodes normal. No abnormal inguinal nodes palpated Neurologic: Grossly normal   Pelvic: External genitalia:  no lesions              Urethra:  normal appearing urethra with no masses, tenderness or lesions              Bartholins and Skenes: normal                 Vagina: normal appearing vagina with normal color and discharge, no lesions              Cervix: no lesions               Bimanual Exam:  Uterus: irregularly shaped, approximately 8 week sized, mobile, tender toward the right over what feels like a myoma.              Adnexa: no mass, fullness, tenderness               Rectovaginal: Confirms               Anus:  normal sphincter tone, no lesions  Chaperone was present for exam.  A:  Well Woman with normal exam  Fibroid uterus  Menorrhagia  Intermenstrual bleeding  Fatigue  Dysmenorrhea  Screening for STD's  Smoker, encouraged to quit  P:   Pap with hpv  STD testing  CBC, ferritin  Return for a sonohysterogram, possible endometrial biopsy  TSH, CMP, vit D, lipids  TDAP  She declines medication for her dysmenorrhea

## 2014-12-08 ENCOUNTER — Other Ambulatory Visit: Payer: Self-pay | Admitting: Obstetrics and Gynecology

## 2014-12-08 DIAGNOSIS — E559 Vitamin D deficiency, unspecified: Secondary | ICD-10-CM

## 2014-12-08 LAB — STD PANEL
HIV 1&2 Ab, 4th Generation: NONREACTIVE
Hepatitis B Surface Ag: NEGATIVE

## 2014-12-08 LAB — HEPATITIS C ANTIBODY: HCV AB: NEGATIVE

## 2014-12-08 LAB — VITAMIN D 25 HYDROXY (VIT D DEFICIENCY, FRACTURES): VIT D 25 HYDROXY: 18 ng/mL — AB (ref 30–100)

## 2014-12-12 LAB — IPS PAP TEST WITH HPV

## 2014-12-12 LAB — IPS N GONORRHOEA AND CHLAMYDIA BY PCR

## 2014-12-19 ENCOUNTER — Telehealth: Payer: Self-pay | Admitting: Obstetrics and Gynecology

## 2014-12-19 NOTE — Telephone Encounter (Signed)
Patient cancelled her ultrasound for Tuesday the 20th because of work and would a call back to reschedule.

## 2014-12-20 NOTE — Telephone Encounter (Signed)
Can we put this patient on hold, so we don't loose her. She really needs this sonohysterogram. Thanks!

## 2014-12-21 NOTE — Telephone Encounter (Signed)
Left message to call Jamey Demchak at [phone]. 

## 2014-12-22 NOTE — Telephone Encounter (Signed)
Called patient to reschedule. Left voicemail. 2nd call. Routing to provider. Please advise.

## 2014-12-22 NOTE — Telephone Encounter (Signed)
Patient returned call. Scheduled for 01/09/15 for SHGM/emb. Patient agreeable to date/time. Patient asked to review benefit. Reviewed with patient understanding.  Reviewed 72 hour cancellation policy and $097 fee. Patient understood and agreeable.

## 2014-12-26 ENCOUNTER — Other Ambulatory Visit: Payer: BLUE CROSS/BLUE SHIELD | Admitting: Obstetrics and Gynecology

## 2014-12-26 ENCOUNTER — Other Ambulatory Visit: Payer: BLUE CROSS/BLUE SHIELD

## 2015-01-09 ENCOUNTER — Other Ambulatory Visit: Payer: Self-pay | Admitting: Obstetrics and Gynecology

## 2015-01-09 ENCOUNTER — Ambulatory Visit (INDEPENDENT_AMBULATORY_CARE_PROVIDER_SITE_OTHER): Payer: BLUE CROSS/BLUE SHIELD | Admitting: Obstetrics and Gynecology

## 2015-01-09 ENCOUNTER — Ambulatory Visit (INDEPENDENT_AMBULATORY_CARE_PROVIDER_SITE_OTHER): Payer: BLUE CROSS/BLUE SHIELD

## 2015-01-09 ENCOUNTER — Encounter: Payer: Self-pay | Admitting: Obstetrics and Gynecology

## 2015-01-09 VITALS — BP 132/88 | HR 82 | Resp 16 | Wt 167.0 lb

## 2015-01-09 DIAGNOSIS — N920 Excessive and frequent menstruation with regular cycle: Secondary | ICD-10-CM

## 2015-01-09 DIAGNOSIS — N923 Ovulation bleeding: Secondary | ICD-10-CM

## 2015-01-09 DIAGNOSIS — N946 Dysmenorrhea, unspecified: Secondary | ICD-10-CM | POA: Insufficient documentation

## 2015-01-09 DIAGNOSIS — Z720 Tobacco use: Secondary | ICD-10-CM

## 2015-01-09 DIAGNOSIS — F17200 Nicotine dependence, unspecified, uncomplicated: Secondary | ICD-10-CM | POA: Insufficient documentation

## 2015-01-09 DIAGNOSIS — N939 Abnormal uterine and vaginal bleeding, unspecified: Secondary | ICD-10-CM

## 2015-01-09 DIAGNOSIS — D259 Leiomyoma of uterus, unspecified: Secondary | ICD-10-CM

## 2015-01-09 NOTE — Addendum Note (Signed)
Addended by: Dorothy Spark on: 01/09/2015 02:27 PM   Modules accepted: Orders

## 2015-01-09 NOTE — Progress Notes (Signed)
GYNECOLOGY  VISIT   HPI: 49 y.o.   Single  African American  female   [phone] with No LMP recorded.   here for evaluation of menorrhagia, intermenstrual bleeding, and severe dysmenorrhea. She has a known fibroid uterus. Not currently sexually active.   GYNECOLOGIC HISTORY: No LMP recorded. Contraception: tubal ligation         OB History    Gravida Para Term Preterm AB TAB SAB Ectopic Multiple Living   3 2   1  1   2          Patient Active Problem List   Diagnosis Date Noted  . Smoker     Past Medical History  Diagnosis Date  . Menorrhagia   . IBS (irritable bowel syndrome)   . Arthritis     hands, knees, lower back  . History of chlamydia   . Stuttering   . Smoker     Past Surgical History  Procedure Laterality Date  . Appendectomy    . Tubal ligation    . Lt foot surgery    . Endometrial ablation    . Dilation and curettage of uterus      No current outpatient prescriptions on file.   No current facility-administered medications for this visit.     ALLERGIES: Penicillins  Family History  Problem Relation Age of Onset  . Hypertension Mother   . Osteoporosis Mother   . Osteoporosis Maternal Grandmother   . Hypertension Maternal Grandmother   . Heart disease Maternal Grandmother   . Cancer Father     stomach  . Diabetes Maternal Grandfather   . Osteoporosis Maternal Grandfather     Social History   Social History  . Marital Status: Single    Spouse Name: N/A  . Number of Children: N/A  . Years of Education: N/A   Occupational History  . Not on file.   Social History Main Topics  . Smoking status: Current Every Day Smoker -- 0.25 packs/day    Types: Cigarettes  . Smokeless tobacco: Never Used  . Alcohol Use: No  . Drug Use: No     Comment: when on her cycle- no loner smoking marijuana 12-07-14   . Sexual Activity:    Partners: Male    Birth Control/ Protection: Surgical     Comment: BTSP 1995   Other Topics Concern  . Not on file    Social History Narrative    ROS  PHYSICAL EXAMINATION:    BP 132/88 mmHg  Pulse 82  Resp 16  Wt 167 lb (75.751 kg)    General appearance: alert, cooperative and appears stated age  Pelvic: External genitalia:  no lesions              Urethra:  normal appearing urethra with no masses, tenderness or lesions              Bartholins and Skenes: normal                 Vagina: normal appearing vagina with normal color and discharge, no lesions              Cervix: no lesions   The risks of sonohysterogram and endometrial biopsy were reviewed with the patient, consents were signed.  Sonohysterogram was performed in the usual sterile fashion. Minimal distention of the endometrial cavity. She does appear to have an irregularly shaped mass of the anterior uterine wall that deviates the endometrium. ? Partially degenerating myoma, partially indistinct boarders. Given  the depth into the uterine wall, not c/w an endometrial polyp. See full ultrasound report.   Endometrial biopsy The risks of endometrial biopsy were reviewed and a consent was obtained. 8 cm. The endometrial biopsy was performed, moderate tissue was obtained. The speculum was removed. There were no complications.                  Chaperone was present for exam.  ASSESSMENT Menorrhagia, intermenstrual bleeding Dysmenorrhea Fibroid uterus Smoker    PLAN Endometrial biopsy Consider MRI to better deliniate anterior uterine wall mass Discussed options of medical management (would start with the minipill) and hysterectomy, the patient is interested in hysterectomy Briefly discussed TLH   An After Visit Summary was printed and given to the patient.  15 minutes face to face time of which over 50% was spent in counseling.

## 2015-01-12 ENCOUNTER — Telehealth: Payer: Self-pay | Admitting: *Deleted

## 2015-01-12 ENCOUNTER — Other Ambulatory Visit: Payer: Self-pay | Admitting: Obstetrics and Gynecology

## 2015-01-12 DIAGNOSIS — N858 Other specified noninflammatory disorders of uterus: Secondary | ICD-10-CM

## 2015-01-12 DIAGNOSIS — D259 Leiomyoma of uterus, unspecified: Secondary | ICD-10-CM

## 2015-01-12 NOTE — Telephone Encounter (Signed)
[name], left message to call back. Ask for triage nurse. 

## 2015-01-12 NOTE — Telephone Encounter (Signed)
-----   Message from Salvadore Dom, MD sent at 01/12/2015 11:15 AM EDT ----- Please inform the patient that her endometrial biopsy was normal and that I'm going to order an MRI to better evaluate her uterus (she is expecting the MRI).  Can you please order the MRI (with and without contrast) at the best location for her.  Reason for the exam is fibroid uterus with abnormal appearance of an anterior uterine wall mass on ultrasound.

## 2015-01-15 NOTE — Telephone Encounter (Signed)
Patient is returning a call to Sally. °

## 2015-01-16 NOTE — Telephone Encounter (Signed)
Patient is returning a call to Triage °

## 2015-01-16 NOTE — Telephone Encounter (Signed)
Return call to patient. Notified endometrial biopsy result was normal per Dr Talbert Nan and MRI is recommended. Patient does not have a preference  on location. Prefers an afternoon appointment.  Advised referral coordinator will call her with appointment.  Upon further review, MRI of chest, abd and pelvis is scheduled [facility]. Is this correct?

## 2015-01-16 NOTE — Telephone Encounter (Signed)
No, She only needs an MRI of her pelvis with and without contrast.

## 2015-01-17 NOTE — Telephone Encounter (Signed)
The order is for MRI of Pelvis with and without contrast. [facility] schedules the appointment with that notation (that is a pre-made appointment type) but the order for MRI pelvis only.

## 2015-01-18 NOTE — Telephone Encounter (Signed)
Spoke with Barbara [name]. Reviewed new date and time for MRI. Patient understands and agreeable.  Ok to close.

## 2015-01-18 NOTE — Telephone Encounter (Signed)
Message left to return call to [facility] at [phone].   To ensure patient aware of MRI appointment that is scheduled 01/31/15 at 1600 at [facility].  Imaging hold.

## 2015-01-31 ENCOUNTER — Ambulatory Visit
Admission: RE | Admit: 2015-01-31 | Discharge: 2015-01-31 | Disposition: A | Discharge status: Home / Self Care | Payer: BLUE CROSS/BLUE SHIELD | Source: Ambulatory Visit | Attending: Obstetrics and Gynecology | Admitting: Obstetrics and Gynecology

## 2015-01-31 DIAGNOSIS — N858 Other specified noninflammatory disorders of uterus: Secondary | ICD-10-CM

## 2015-01-31 DIAGNOSIS — D259 Leiomyoma of uterus, unspecified: Secondary | ICD-10-CM

## 2015-01-31 MED ORDER — GADOBENATE DIMEGLUMINE 529 MG/ML IV SOLN
15.0000 mL | Freq: Once | INTRAVENOUS | Status: AC | PRN
  Administered 2015-01-31: 15 mL via INTRAVENOUS

## 2015-02-02 ENCOUNTER — Telehealth: Payer: Self-pay

## 2015-02-02 NOTE — Telephone Encounter (Signed)
Left message to call Arinze Rivadeneira at [phone]. 

## 2015-02-02 NOTE — Telephone Encounter (Signed)
-----   Message from Nunzio Cobbs, MD sent at 02/02/2015  5:58 AM EDT ----- This is Dr. Quincy Simmonds reviewing Dr. Gentry Fitz inbox.  Please report MRI results to patient.  Anterior area of the uterus seen on ultrasound looks like adenomyosis, endometriosis in the wall of the uterus.  She also does have a 5 cm fibroid on the left side which sits near the ovary on that side. Ovaries look normal..  Dr. Talbert Nan will review the reports upon her review and make final recommendations.   Cc- Starlyn Skeans

## 2015-02-06 ENCOUNTER — Other Ambulatory Visit: Payer: Self-pay

## 2015-02-06 ENCOUNTER — Other Ambulatory Visit: Payer: Self-pay | Admitting: Obstetrics and Gynecology

## 2015-02-06 MED ORDER — METRONIDAZOLE 500 MG PO TABS: 500.0000 mg | ORAL_TABLET | Freq: Two times a day (BID) | ORAL | Status: DC

## 2015-02-06 NOTE — Telephone Encounter (Signed)
Spoke with patient. Advised of results as seen below. Advised will review results with Dr.Jertson and return call with further recommendations. Patient is agreeable.

## 2015-02-06 NOTE — Telephone Encounter (Signed)
Spoke with the patient, reviewed her MRI results. She would like to proceed with hysterectomy. Discussed TLH/BS. She will need to return for a pre-op. Also discussed the trich and BV on pap. She doesn't currently have a sexual partner, was advised to tell her last partner about the trich and that he needs to be treated.  Will treat the patient with flagyl 500 mg BID x 7 days. F/U 2 weeks after treatment for an affirm to make sure it has cleared.  Will schedule the TLS/BS at least a month out.

## 2015-02-06 NOTE — Telephone Encounter (Signed)
Returned call to the patient, left her a message to call back and speak with me (please give the call to [facility] if I'm unavailable). I agree her MRI is very reassuring, nothing concerning seen. We can schedule hysterectomy if she is ready or we can further discuss medical treatment options.  On review of her chart, I noticed that although her pap was normal and satisfactory, it did report trich and BV. She does need to be treated. Since she has both trich and BV will treat with Flagyl 500 mg BID x 7 days. 2 week after finishing treatment, she needs to return for an affirm test. Her partner or ex partner should see his MD and be treated. If sexually active, she shouldn't be active until 1 week after they have both completed treatment.

## 2015-02-06 NOTE — Telephone Encounter (Signed)
Routing to [place] for final review before trying to reach patient again.

## 2015-02-09 NOTE — Telephone Encounter (Signed)
Called patient to review surgery benefits for TLH/BS. Left voicemail for return call.

## 2015-02-14 NOTE — Telephone Encounter (Signed)
Second call to patient to review surgery benefits. If Becky unavailable, ok to speak with Janett Billow for benefit. Benefit listed in guarantor notes. Left voicemail with this information and request for return call.

## 2015-02-21 NOTE — Telephone Encounter (Signed)
[name]. Left message to call back. Requesting update on scheduling.

## 2015-02-23 NOTE — Telephone Encounter (Signed)
[name]. Declines to proceed with surgery at this time. Offered office visit to discuss other treatment options. Patient states she has appointment with Dr Barbara [name] on 03-07-15 and she will wait and discuss at that time.  Again offered earlier appointment and patient declines.  Routing to provider for final review.  Will close encounter.

## 2015-03-07 ENCOUNTER — Encounter: Payer: Self-pay | Admitting: Obstetrics and Gynecology

## 2015-03-07 ENCOUNTER — Ambulatory Visit (INDEPENDENT_AMBULATORY_CARE_PROVIDER_SITE_OTHER): Payer: BLUE CROSS/BLUE SHIELD | Admitting: Obstetrics and Gynecology

## 2015-03-07 VITALS — BP 130/80 | HR 84 | Resp 14 | Wt 167.0 lb

## 2015-03-07 DIAGNOSIS — N946 Dysmenorrhea, unspecified: Secondary | ICD-10-CM

## 2015-03-07 DIAGNOSIS — D259 Leiomyoma of uterus, unspecified: Secondary | ICD-10-CM | POA: Diagnosis not present

## 2015-03-07 DIAGNOSIS — N920 Excessive and frequent menstruation with regular cycle: Secondary | ICD-10-CM

## 2015-03-07 NOTE — Patient Instructions (Signed)
Intrauterine Device Insertion Most often, an intrauterine device (IUD) is inserted into the uterus to prevent pregnancy. There are 2 types of IUDs available:  Copper IUD--This type of IUD creates an environment that is not favorable to sperm survival. The mechanism of action of the copper IUD is not known for certain. It can stay in place for 10 years.  Hormone IUD--This type of IUD contains the hormone progestin (synthetic progesterone). The progestin thickens the cervical mucus and prevents sperm from entering the uterus, and it also thins the uterine lining. There is no evidence that the hormone IUD prevents implantation. One hormone IUD can stay in place for up to 5 years, and a different hormone IUD can stay in place for up to 3 years. An IUD is the most cost-effective birth control if left in place for the full duration. It may be removed at any time. LET YOUR HEALTH CARE PROVIDER KNOW ABOUT:  Any allergies you have.  All medicines you are taking, including vitamins, herbs, eye drops, creams, and over-the-counter medicines.  Previous problems you or members of your family have had with the use of anesthetics.  Any blood disorders you have.  Previous surgeries you have had.  Possibility of pregnancy.  Medical conditions you have. RISKS AND COMPLICATIONS  Generally, intrauterine device insertion is a safe procedure. However, as with any procedure, complications can occur. Possible complications include:  Accidental puncture (perforation) of the uterus.  Accidental placement of the IUD either in the muscle layer of the uterus (myometrium) or outside the uterus. If this happens, the IUD can be found essentially floating around the bowels and must be taken out surgically.  The IUD may fall out of the uterus (expulsion). This is more common in women who have recently had a child.   Pregnancy in the fallopian tube (ectopic).  Pelvic inflammatory disease (PID), which is infection of  the uterus and fallopian tubes. The risk of PID is slightly increased in the first 20 days after the IUD is placed, but the overall risk is still very low. BEFORE THE PROCEDURE  Schedule the IUD insertion for when you will have your menstrual period or right after, to make sure you are not pregnant. Placement of the IUD is better tolerated shortly after a menstrual cycle.  You may need to take tests or be examined to make sure you are not pregnant.  You may be required to take a pregnancy test.  You may be required to get checked for sexually transmitted infections (STIs) prior to placement. Placing an IUD in someone who has an infection can make the infection worse.  You may be given a pain reliever to take 1 or 2 hours before the procedure.  An exam will be performed to determine the size and position of your uterus.  Ask your health care provider about changing or stopping your regular medicines. PROCEDURE   A tool (speculum) is placed in the vagina. This allows your health care provider to see the lower part of the uterus (cervix).  The cervix is prepped with a medicine that lowers the risk of infection.  You may be given a medicine to numb each side of the cervix (intracervical or paracervical block). This is used to block and control any discomfort with insertion.  A tool (uterine sound) is inserted into the uterus to determine the length of the uterine cavity and the direction the uterus may be tilted.  A slim instrument (IUD inserter) is inserted through the cervical   canal and into your uterus.  The IUD is placed in the uterine cavity and the insertion device is removed.  The nylon string that is attached to the IUD and used for eventual IUD removal is trimmed. It is trimmed so that it lays high in the vagina, just outside the cervix. AFTER THE PROCEDURE  You may have bleeding after the procedure. This is normal. It varies from light spotting for a few days to menstrual-like  bleeding.  You may have mild cramping.   This information is not intended to replace advice given to you by your health care provider. Make sure you discuss any questions you have with your health care provider.   Document Released: 11/20/2010 Document Revised: 01/12/2013 Document Reviewed: 09/12/2012 Elsevier Interactive Patient Education 2016 Elsevier Inc.  

## 2015-03-07 NOTE — Progress Notes (Signed)
Patient ID: Barbara [name], female   DOB: 1965/04/27, 49 y.o.   MRN: [id number] GYNECOLOGY  VISIT   HPI: 49 y.o.   Single  African American  female   [phone] with Patient's last menstrual period was 02/21/2015.   here to follow up on her test results and treatment options. The patient has a symptomatic fibroid uterus, was going to have a hysterectomy, wasn't able to secondary to cost. She is here to further discuss medical management. Not sexually active for the last 5 months.  GYNECOLOGIC HISTORY: Patient's last menstrual period was 02/21/2015. Contraception: tubal Ligation  Menopausal hormone therapy: N/A        OB History    Gravida Para Term Preterm AB TAB SAB Ectopic Multiple Living   3 2   1  1   2          Patient Active Problem List   Diagnosis Date Noted  . Smoker   . Menorrhagia   . Dysmenorrhea     Past Medical History  Diagnosis Date  . Menorrhagia   . IBS (irritable bowel syndrome)   . Arthritis     hands, knees, lower back  . History of chlamydia   . Stuttering   . Smoker   . Dysmenorrhea     Past Surgical History  Procedure Laterality Date  . Appendectomy    . Tubal ligation    . Lt foot surgery    . Endometrial ablation    . Dilation and curettage of uterus      No current outpatient prescriptions on file.   No current facility-administered medications for this visit.     ALLERGIES: Penicillins  Family History  Problem Relation Age of Onset  . Hypertension Mother   . Osteoporosis Mother   . Osteoporosis Maternal Grandmother   . Hypertension Maternal Grandmother   . Heart disease Maternal Grandmother   . Cancer Father     stomach  . Diabetes Maternal Grandfather   . Osteoporosis Maternal Grandfather     Social History   Social History  . Marital Status: Single    Spouse Name: N/A  . Number of Children: N/A  . Years of Education: N/A   Occupational History  . Not on file.   Social History Main Topics  . Smoking status: Current  Every Day Smoker -- 0.25 packs/day    Types: Cigarettes  . Smokeless tobacco: Never Used  . Alcohol Use: No  . Drug Use: No     Comment: when on her cycle- no loner smoking marijuana 12-07-14   . Sexual Activity:    Partners: Male    Birth Control/ Protection: Surgical     Comment: BTSP 1995   Other Topics Concern  . Not on file   Social History Narrative    Review of Systems  Genitourinary:       Painful periods  Neurological: Positive for headaches.  All other systems reviewed and are negative.   PHYSICAL EXAMINATION:    BP 130/80 mmHg  Pulse 84  Resp 14  Wt 167 lb (75.751 kg)  LMP 02/21/2015    General appearance: alert, cooperative and appears stated age  ASSESSMENT Symptomatic fibroid uterus, menorrhagia, dysmenorrhea. Not anemic, fibroid deviates the cavity some, negative endometrial biopsy. Discussed the minipill and the mirena IUD, she desires the mirena IUD. Cavity was 8 cm. H/O TL Discussed the risks and side effects of the mirena, information given    PLAN Return for mirena IUD insertion  An After Visit Summary was printed and given to the patient.  Over 10 minutes face to face time of which over 50% was spent in counseling.

## 2015-03-08 ENCOUNTER — Telehealth: Payer: Self-pay | Admitting: Obstetrics and Gynecology

## 2015-03-08 DIAGNOSIS — D259 Leiomyoma of uterus, unspecified: Secondary | ICD-10-CM

## 2015-03-08 DIAGNOSIS — N946 Dysmenorrhea, unspecified: Secondary | ICD-10-CM

## 2015-03-08 DIAGNOSIS — N920 Excessive and frequent menstruation with regular cycle: Secondary | ICD-10-CM

## 2015-03-08 NOTE — Telephone Encounter (Signed)
Patient is ready to schedule Mirena insertion. Last seen 03/07/2015.

## 2015-03-09 NOTE — Telephone Encounter (Signed)
Patient calling to schedule Mirena best # to reach [phone].

## 2015-03-09 NOTE — Telephone Encounter (Signed)
Spoke with patient and scheduled procedure appointment for Mirena IUD insertion with Dr. Talbert Nan for 03/15/15 at 1530. Pre-procedure instructions given. Motrin 800 mg PO one hour before appointment with food and water.  Patient with history of tubal ligation, okay to schedule at any time per Dr. Talbert Nan.  Routing to provider for final review. Patient agreeable to disposition. Will close encounter.

## 2015-03-09 NOTE — Telephone Encounter (Signed)
Discussed benefit for mirena insertion with patient. Patient understands and agreeable to benefit. Patient ready to schedule. No further insurance questions. Transferred to [employer] for scheduling.

## 2015-03-09 NOTE — Telephone Encounter (Signed)
Becky,  Can you please pre-cert Mirena IUD for fibroids/dysmenorrhea/menorrhagia. Order is placed.

## 2015-03-15 ENCOUNTER — Encounter: Payer: Self-pay | Admitting: Obstetrics and Gynecology

## 2015-03-15 ENCOUNTER — Other Ambulatory Visit (INDEPENDENT_AMBULATORY_CARE_PROVIDER_SITE_OTHER): Payer: BLUE CROSS/BLUE SHIELD

## 2015-03-15 ENCOUNTER — Ambulatory Visit (INDEPENDENT_AMBULATORY_CARE_PROVIDER_SITE_OTHER): Payer: BLUE CROSS/BLUE SHIELD | Admitting: Obstetrics and Gynecology

## 2015-03-15 VITALS — BP 122/78 | HR 84 | Resp 16 | Wt 168.0 lb

## 2015-03-15 DIAGNOSIS — Z30430 Encounter for insertion of intrauterine contraceptive device: Secondary | ICD-10-CM | POA: Diagnosis not present

## 2015-03-15 DIAGNOSIS — N920 Excessive and frequent menstruation with regular cycle: Secondary | ICD-10-CM

## 2015-03-15 DIAGNOSIS — N946 Dysmenorrhea, unspecified: Secondary | ICD-10-CM

## 2015-03-15 DIAGNOSIS — D259 Leiomyoma of uterus, unspecified: Secondary | ICD-10-CM

## 2015-03-15 NOTE — Progress Notes (Signed)
Patient ID: Barbara [name], female   DOB: [birth year], 49 y.o.   MRN: [id number] GYNECOLOGY  VISIT   HPI: 49 y.o.   Single  African American  female   [phone] with Patient's last menstrual period was 03/15/2015.   here for Mirena IUD insertion. She has a fibroid uterus with menorrhagia and dysmenorrhea.   GYNECOLOGIC HISTORY: Patient's last menstrual period was 03/15/2015. Contraception: not sexually active at this time Menopausal hormone therapy: None        OB History    Gravida Para Term Preterm AB TAB SAB Ectopic Multiple Living   3 2   1  1   2          Patient Active Problem List   Diagnosis Date Noted  . Smoker   . Menorrhagia   . Dysmenorrhea     Past Medical History  Diagnosis Date  . Menorrhagia   . IBS (irritable bowel syndrome)   . Arthritis     hands, knees, lower back  . History of chlamydia   . Stuttering   . Smoker   . Dysmenorrhea     Past Surgical History  Procedure Laterality Date  . Appendectomy    . Tubal ligation    . Lt foot surgery    . Endometrial ablation    . Dilation and curettage of uterus      No current outpatient prescriptions on file.   No current facility-administered medications for this visit.     ALLERGIES: Penicillins  Family History  Problem Relation Age of Onset  . Hypertension Mother   . Osteoporosis Mother   . Osteoporosis Maternal Grandmother   . Hypertension Maternal Grandmother   . Heart disease Maternal Grandmother   . Cancer Father     stomach  . Diabetes Maternal Grandfather   . Osteoporosis Maternal Grandfather     Social History   Social History  . Marital Status: Single    Spouse Name: N/A  . Number of Children: N/A  . Years of Education: N/A   Occupational History  . Not on file.   Social History Main Topics  . Smoking status: Current Every Day Smoker -- 0.25 packs/day    Types: Cigarettes  . Smokeless tobacco: Never Used  . Alcohol Use: No  . Drug Use: No     Comment: when on her  cycle- no loner smoking marijuana 12-07-14   . Sexual Activity:    Partners: Male    Birth Control/ Protection: Surgical     Comment: BTSP 1995   Other Topics Concern  . Not on file   Social History Narrative    Review of Systems  Genitourinary:       Heavy menstrual bleeding  All other systems reviewed and are negative.   PHYSICAL EXAMINATION:    BP 122/78 mmHg  Pulse 84  Resp 16  Wt 168 lb (76.204 kg)  LMP 03/15/2015    General appearance: alert, cooperative and appears stated age  Pelvic: External genitalia:  no lesions              Urethra:  normal appearing urethra with no masses, tenderness or lesions              Bartholins and Skenes: normal                 Vagina: normal appearing vagina with normal color and discharge, no lesions              Cervix:  no lesions  The risks of the mirena IUD were reviewed with the patient, including infection, abnormal bleeding and uterine perfortion. Consent was signed.  A speculum was placed in the vagina, the cervix was cleansed with betadine. A tenaculum was placed on the cervix. The cervix was dilated to a #5 hagar dilator, the dilation started with a minidilator and was difficult. The cavity sounded to 8 cm.   The mirena IUD was inserted. The string were cut to 3-4 cm. The tenaculum was removed. Slight oozing from the tenaculum site was stopped with pressure.   The patient tolerated the procedure well.   A post IUD ultrasound showed the IUD was properly in her cavity                Chaperone was present for exam.  ASSESSMENT Fibroid uterus, menorrhagia, dysmenorrhea Mirena IUD insertion    PLAN IUD inserted F/U in 1 month   An After Visit Summary was printed and given to the patient.

## 2015-03-16 ENCOUNTER — Encounter: Payer: Self-pay | Admitting: Obstetrics and Gynecology

## 2015-04-12 ENCOUNTER — Encounter: Payer: Self-pay | Admitting: Obstetrics and Gynecology

## 2015-04-12 ENCOUNTER — Ambulatory Visit (INDEPENDENT_AMBULATORY_CARE_PROVIDER_SITE_OTHER): Payer: BLUE CROSS/BLUE SHIELD | Admitting: Obstetrics and Gynecology

## 2015-04-12 VITALS — BP 132/84 | HR 102 | Resp 14 | Wt 162.0 lb

## 2015-04-12 DIAGNOSIS — D259 Leiomyoma of uterus, unspecified: Secondary | ICD-10-CM | POA: Diagnosis not present

## 2015-04-12 DIAGNOSIS — Z30431 Encounter for routine checking of intrauterine contraceptive device: Secondary | ICD-10-CM

## 2015-04-12 NOTE — Progress Notes (Signed)
Patient ID: Barbara [name], female   DOB: Mar 10, 1966, 50 y.o.   MRN: [id number] GYNECOLOGY  VISIT   HPI: 50 y.o.   Single  African American  female   [phone] with Patient's last menstrual period was 04/10/2015.   here to follow up on her Mirena IUD. The mirena was placed last month for treatment of her symptomatic fibroid uterus (menorrhagia, BTB and dysmenorrhea). She has had one cycle since the mirena was inserted (currently on it), light bleeding and no cramping. Not sexually active for 6 months. No side effects.   GYNECOLOGIC HISTORY: Patient's last menstrual period was 04/10/2015. Contraception:IUD Menopausal hormone therapy: None        OB History    Gravida Para Term Preterm AB TAB SAB Ectopic Multiple Living   3 2   1  1   2          Patient Active Problem List   Diagnosis Date Noted  . Smoker   . Menorrhagia   . Dysmenorrhea     Past Medical History  Diagnosis Date  . Menorrhagia   . IBS (irritable bowel syndrome)   . Arthritis     hands, knees, lower back  . History of chlamydia   . Stuttering   . Smoker   . Dysmenorrhea     Past Surgical History  Procedure Laterality Date  . Appendectomy    . Tubal ligation    . Lt foot surgery    . Endometrial ablation    . Dilation and curettage of uterus      Current Outpatient Prescriptions  Medication Sig Dispense Refill  . levonorgestrel (MIRENA) 20 MCG/24HR IUD 1 each by Intrauterine route once.     No current facility-administered medications for this visit.     ALLERGIES: Penicillins  Family History  Problem Relation Age of Onset  . Hypertension Mother   . Osteoporosis Mother   . Osteoporosis Maternal Grandmother   . Hypertension Maternal Grandmother   . Heart disease Maternal Grandmother   . Cancer Father     stomach  . Diabetes Maternal Grandfather   . Osteoporosis Maternal Grandfather     Social History   Social History  . Marital Status: Single    Spouse Name: N/A  . Number of Children:  N/A  . Years of Education: N/A   Occupational History  . Not on file.   Social History Main Topics  . Smoking status: Current Every Day Smoker -- 0.25 packs/day    Types: Cigarettes  . Smokeless tobacco: Never Used  . Alcohol Use: No  . Drug Use: No     Comment: when on her cycle- no loner smoking marijuana 12-07-14   . Sexual Activity:    Partners: Male    Birth Control/ Protection: Surgical     Comment: BTSP 1995   Other Topics Concern  . Not on file   Social History Narrative    Review of Systems  Constitutional: Negative.   HENT: Negative.   Eyes: Negative.   Respiratory: Negative.   Cardiovascular: Negative.   Gastrointestinal: Negative.   Genitourinary: Negative.   Musculoskeletal: Negative.   Skin: Negative.   Neurological: Negative.   Endo/Heme/Allergies: Negative.   Psychiatric/Behavioral: Negative.     PHYSICAL EXAMINATION:    BP 132/84 mmHg  Pulse 102  Resp 14  Wt 162 lb (73.483 kg)  LMP 04/10/2015    General appearance: alert, cooperative and appears stated age  Pelvic: External genitalia:  no lesions  Urethra:  normal appearing urethra with no masses, tenderness or lesions              Bartholins and Skenes: normal                 Vagina: normal appearing vagina with normal color and discharge, no lesions              Cervix: no lesions, IUD string 2 cm.              Bimanual Exam:  Uterus:  uterus is irregular, mobile, approximately 10 week sized.               Adnexa: no mass, fullness, tenderness               Chaperone was present for exam.  ASSESSMENT IUD check, doing well with the mirena. Her first cycle has been light, no cramping.  Fibroid uterus, symptoms improved    PLAN Routine f/u   An After Visit Summary was printed and given to the patient.

## 2015-09-30 ENCOUNTER — Emergency Department (HOSPITAL_COMMUNITY)
Admission: EM | Admit: 2015-09-30 | Discharge: 2015-09-30 | Disposition: A | Discharge status: Home / Self Care | Payer: BLUE CROSS/BLUE SHIELD | Attending: Emergency Medicine | Admitting: Emergency Medicine

## 2015-09-30 ENCOUNTER — Encounter (HOSPITAL_COMMUNITY): Payer: Self-pay

## 2015-09-30 DIAGNOSIS — M1990 Unspecified osteoarthritis, unspecified site: Secondary | ICD-10-CM | POA: Insufficient documentation

## 2015-09-30 DIAGNOSIS — H1131 Conjunctival hemorrhage, right eye: Secondary | ICD-10-CM | POA: Diagnosis not present

## 2015-09-30 DIAGNOSIS — F17210 Nicotine dependence, cigarettes, uncomplicated: Secondary | ICD-10-CM | POA: Insufficient documentation

## 2015-09-30 DIAGNOSIS — H5711 Ocular pain, right eye: Secondary | ICD-10-CM | POA: Diagnosis present

## 2015-09-30 NOTE — ED Provider Notes (Signed)
CSN: [id number]     Arrival date & time 09/30/15  [id number] History   First MD Initiated Contact with Patient 09/30/15 [phone]     Chief Complaint  Patient presents with  . eye pressure      (Consider location/radiation/quality/duration/timing/severity/associated sxs/prior Treatment) Patient is a 50 y.o. female presenting with eye pain. The history is provided by the patient. No language interpreter was used.  Eye Pain This is a new problem. The current episode started in the past 7 days. The problem occurs constantly. The problem has been unchanged. Pertinent negatives include no weakness. Nothing aggravates the symptoms. She has tried nothing for the symptoms. The treatment provided no relief.  Pt reports she has had stinging in her right eye since Friday.  Pt noticed redness on Friday.   Past Medical History  Diagnosis Date  . Menorrhagia   . IBS (irritable bowel syndrome)   . Arthritis     hands, knees, lower back  . History of chlamydia   . Stuttering   . Smoker   . Dysmenorrhea    Past Surgical History  Procedure Laterality Date  . Appendectomy    . Tubal ligation    . Lt foot surgery    . Endometrial ablation    . Dilation and curettage of uterus     Family History  Problem Relation Age of Onset  . Hypertension Mother   . Osteoporosis Mother   . Osteoporosis Maternal Grandmother   . Hypertension Maternal Grandmother   . Heart disease Maternal Grandmother   . Cancer Father     stomach  . Diabetes Maternal Grandfather   . Osteoporosis Maternal Grandfather    Social History  Substance Use Topics  . Smoking status: Current Every Day Smoker -- 0.25 packs/day    Types: Cigarettes  . Smokeless tobacco: Never Used  . Alcohol Use: No   OB History    Gravida Para Term Preterm AB TAB SAB Ectopic Multiple Living   3 2   1  1   2      Review of Systems  Eyes: Positive for pain.  Neurological: Negative for weakness.  All other systems reviewed and are  negative.     Allergies  Penicillins  Home Medications   Prior to Admission medications   Medication Sig Start Date End Date Taking? Authorizing Provider  levonorgestrel (MIRENA) 20 MCG/24HR IUD 1 each by Intrauterine route once.    Historical Provider, MD   BP 139/92 mmHg  Pulse 80  Temp(Src) 98 F (36.7 C) (Oral)  Resp 16  Ht 5\' 11"  (1.803 m)  Wt 73.483 kg  BMI 22.60 kg/m2  SpO2 96% Physical Exam  Constitutional: She is oriented to person, place, and time. She appears well-developed and well-nourished.  HENT:  Head: Normocephalic.  Eyes: EOM are normal.  Injected right lateral conjunctiva  Neck: Normal range of motion.  Pulmonary/Chest: Effort normal.  Abdominal: She exhibits no distension.  Musculoskeletal: Normal range of motion.  Neurological: She is alert and oriented to person, place, and time.  Psychiatric: She has a normal mood and affect.  Nursing note and vitals reviewed.   ED Course  Procedures (including critical care time) Labs Review Labs Reviewed - No data to display  Imaging Review No results found. I have personally reviewed and evaluated these images and lab results as part of my medical decision-making.   EKG Interpretation None      MDM   Final diagnoses:  Subconjunctival bleed, right  An After Visit Summary was printed and given to the patient. Follow up with Dr. Iona Hansen if any problems.   Hollace Kinnier [place], PA-C 09/30/15 1059  Julianne Rice, MD 10/05/15 [phone]

## 2015-09-30 NOTE — ED Notes (Signed)
Pt reports noticed redness to r eye Friday and and later started feeling pressure in r eye.

## 2015-09-30 NOTE — Discharge Instructions (Signed)
Subconjunctival Hemorrhage °Subconjunctival hemorrhage is bleeding that happens between the white part of your eye (sclera) and the clear membrane that covers the outside of your eye (conjunctiva). There are many tiny blood vessels near the surface of your eye. A subconjunctival hemorrhage happens when one or more of these vessels breaks and bleeds, causing a red patch to appear on your eye. This is similar to a bruise. °Depending on the amount of bleeding, the red patch may only cover a small area of your eye or it may cover the entire visible part of the sclera. If a lot of blood collects under the conjunctiva, there may also be swelling. Subconjunctival hemorrhages do not affect your vision or cause pain, but your eye may feel irritated if there is swelling. Subconjunctival hemorrhages usually do not require treatment, and they disappear on their own within two weeks. °CAUSES °This condition may be caused by: °· Mild trauma, such as rubbing your eye too hard. °· Severe trauma or blunt injuries. °· Coughing, sneezing, or vomiting. °· Straining, such as when lifting a heavy object. °· High blood pressure. °· Recent eye surgery. °· A history of diabetes. °· Certain medicines, especially blood thinners (anticoagulants). °· Other conditions, such as eye tumors, bleeding disorders, or blood vessel abnormalities. °Subconjunctival hemorrhages can happen without an obvious cause.  °SYMPTOMS  °Symptoms of this condition include: °· A bright red or dark red patch on the white part of the eye. °¨ The red area may spread out to cover a larger area of the eye before it goes away. °¨ The red area may turn brownish-yellow before it goes away. °· Swelling. °· Mild eye irritation. °DIAGNOSIS °This condition is diagnosed with a physical exam. If your subconjunctival hemorrhage was caused by trauma, your health care provider may refer you to an eye specialist (ophthalmologist) or another specialist to check for other injuries. You  may have other tests, including: °· An eye exam. °· A blood pressure check. °· Blood tests to check for bleeding disorders. °If your subconjunctival hemorrhage was caused by trauma, X-rays or a CT scan may be done to check for other injuries. °TREATMENT °Usually, no treatment is needed. Your health care provider may recommend eye drops or cold compresses to help with discomfort. °HOME CARE INSTRUCTIONS °· Take over-the-counter and prescription medicines only as directed by your health care provider. °· Use eye drops or cold compresses to help with discomfort as directed by your health care provider. °· Avoid activities, things, and environments that may irritate or injure your eye. °· Keep all follow-up visits as told by your health care provider. This is important. °SEEK MEDICAL CARE IF: °· You have pain in your eye. °· The bleeding does not go away within 3 weeks. °· You keep getting new subconjunctival hemorrhages. °SEEK IMMEDIATE MEDICAL CARE IF: °· Your vision changes or you have difficulty seeing. °· You suddenly develop severe sensitivity to light. °· You develop a severe headache, persistent vomiting, confusion, or abnormal tiredness (lethargy). °· Your eye seems to bulge or protrude from your eye socket. °· You develop unexplained bruises on your body. °· You have unexplained bleeding in another area of your body. °  °This information is not intended to replace advice given to you by your health care provider. Make sure you discuss any questions you have with your health care provider. °  °Document Released: 03/24/2005 Document Revised: 12/13/2014 Document Reviewed: 05/31/2014 °Elsevier Interactive Patient Education ©2016 [employer] ° °

## 2015-12-19 ENCOUNTER — Encounter: Payer: Self-pay | Admitting: Obstetrics and Gynecology

## 2015-12-19 ENCOUNTER — Ambulatory Visit: Payer: BLUE CROSS/BLUE SHIELD | Admitting: Obstetrics and Gynecology

## 2015-12-19 VITALS — BP 142/70 | HR 88 | Resp 13 | Ht 68.25 in | Wt 163.0 lb

## 2015-12-19 DIAGNOSIS — Z30431 Encounter for routine checking of intrauterine contraceptive device: Secondary | ICD-10-CM

## 2015-12-19 DIAGNOSIS — F17200 Nicotine dependence, unspecified, uncomplicated: Secondary | ICD-10-CM

## 2015-12-19 DIAGNOSIS — N951 Menopausal and female climacteric states: Secondary | ICD-10-CM

## 2015-12-19 DIAGNOSIS — Z0000 Encounter for general adult medical examination without abnormal findings: Secondary | ICD-10-CM

## 2015-12-19 DIAGNOSIS — Z720 Tobacco use: Secondary | ICD-10-CM | POA: Diagnosis not present

## 2015-12-19 DIAGNOSIS — D259 Leiomyoma of uterus, unspecified: Secondary | ICD-10-CM

## 2015-12-19 DIAGNOSIS — R61 Generalized hyperhidrosis: Secondary | ICD-10-CM | POA: Diagnosis not present

## 2015-12-19 DIAGNOSIS — Z113 Encounter for screening for infections with a predominantly sexual mode of transmission: Secondary | ICD-10-CM | POA: Diagnosis not present

## 2015-12-19 DIAGNOSIS — Z01419 Encounter for gynecological examination (general) (routine) without abnormal findings: Secondary | ICD-10-CM

## 2015-12-19 DIAGNOSIS — R030 Elevated blood-pressure reading, without diagnosis of hypertension: Secondary | ICD-10-CM

## 2015-12-19 DIAGNOSIS — E559 Vitamin D deficiency, unspecified: Secondary | ICD-10-CM

## 2015-12-19 DIAGNOSIS — R232 Flushing: Secondary | ICD-10-CM

## 2015-12-19 LAB — CBC
HEMATOCRIT: 43 % (ref 35.0–45.0)
HEMOGLOBIN: 14.6 g/dL (ref 11.7–15.5)
MCH: 31.7 pg (ref 27.0–33.0)
MCHC: 34 g/dL (ref 32.0–36.0)
MCV: 93.3 fL (ref 80.0–100.0)
MPV: 9.5 fL (ref 7.5–12.5)
Platelets: 230 10*3/uL (ref 140–400)
RBC: 4.61 MIL/uL (ref 3.80–5.10)
RDW: 13.4 % (ref 11.0–15.0)
WBC: 5.5 10*3/uL (ref 3.8–10.8)

## 2015-12-19 NOTE — Progress Notes (Signed)
50 y.o. [id number] SingleAfrican AmericanF here for annual exam.  She has a mirena IUD for menorrhagia and dysmenorrhea associated with a fibroid uterus. Not sexually active since July. No worries about STD's. No pain with intercourse. On and off partner.  She c/o hot flashes since she was in her 20's, night sweats. No change  Period Cycle (Days): 28 Period Duration (Days): 3 days Period Pattern: Regular Menstrual Flow: Light Menstrual Control: Thin pad Dysmenorrhea: None  Patient's last menstrual period was 12/13/2015.          Sexually active: No.  The current method of family planning is IUD.    Exercising: No.  The patient does not participate in regular exercise at present. Smoker:  yes  Health Maintenance: Pap:  12-07-14 WNL NEG HR HPV History of abnormal Pap:  no MMG:  08-14-10 WNL  Colonoscopy:  Never BMD:   Never TDaP:  12-07-14 Gardasil: N/A   reports that she has been smoking Cigarettes.  She has been smoking about 0.25 packs per day. She has never used smokeless tobacco. She reports that she does not drink alcohol or use drugs.  She has 2 new grandchildren (granddaughter 68 months, grandson is 38 days old). Both are local    Past Medical History:  Diagnosis Date  . Arthritis    hands, knees, lower back  . Dysmenorrhea   . History of chlamydia   . IBS (irritable bowel syndrome)   . Menorrhagia   . Smoker   . Stuttering     Past Surgical History:  Procedure Laterality Date  . APPENDECTOMY    . DILATION AND CURETTAGE OF UTERUS    . ENDOMETRIAL ABLATION    . lt foot surgery    . TUBAL LIGATION      Current Outpatient Prescriptions  Medication Sig Dispense Refill  . levonorgestrel (MIRENA) 20 MCG/24HR IUD 1 each by Intrauterine route once.     No current facility-administered medications for this visit.     Family History  Problem Relation Age of Onset  . Hypertension Mother   . Osteoporosis Mother   . Osteoporosis Maternal Grandmother   . Hypertension  Maternal Grandmother   . Heart disease Maternal Grandmother   . Cancer Father     stomach  . Diabetes Maternal Grandfather   . Osteoporosis Maternal Grandfather     Review of Systems  Constitutional: Negative.   HENT: Negative.   Eyes: Negative.   Respiratory: Negative.   Cardiovascular: Negative.   Gastrointestinal: Negative.   Endocrine: Negative.   Genitourinary: Negative.   Musculoskeletal: Negative.   Skin: Negative.   Allergic/Immunologic: Negative.   Neurological: Negative.   Psychiatric/Behavioral: Negative.     Exam:   BP (!) 142/86 (BP Location: Right Arm, Patient Position: Sitting, Cuff Size: Normal)   Pulse 88   Resp 13   Ht 5' 8.25" (1.734 m)   Wt 163 lb (73.9 kg)   LMP 12/13/2015   BMI 24.60 kg/m   Weight change: @WEIGHTCHANGE @ Height:   Height: 5' 8.25" (173.4 cm)  Ht Readings from Last 3 Encounters:  12/19/15 5' 8.25" (1.734 m)  09/30/15 5\' 11"  (1.803 m)  12/07/14 5' 9.5" (1.765 m)    General appearance: alert, cooperative and appears stated age Head: Normocephalic, without obvious abnormality, atraumatic Neck: no adenopathy, supple, symmetrical, trachea midline and thyroid normal to inspection and palpation Lungs: clear to auscultation bilaterally Breasts: normal appearance, no masses or tenderness Heart: regular rate and rhythm Abdomen: soft, non-tender; bowel sounds normal;  no masses,  no organomegaly Extremities: extremities normal, atraumatic, no cyanosis or edema Skin: Skin color, texture, turgor normal. No rashes or lesions Lymph nodes: Cervical, supraclavicular, and axillary nodes normal. No abnormal inguinal nodes palpated Neurologic: Grossly normal   Pelvic: External genitalia:  no lesions              Urethra:  normal appearing urethra with no masses, tenderness or lesions              Bartholins and Skenes: normal                 Vagina: normal appearing vagina with normal color and discharge, no lesions              Cervix: no  lesions, IUD string 3 cm               Bimanual Exam:  Uterus:  slightly enlarged fibroid uterus, irregular, approximately 10 week sized (feels smaller)              Adnexa: no mass, fullness, tenderness               Rectovaginal: Confirms               Anus:  normal sphincter tone, no lesions  Chaperone was present for exam.  A:  Well Woman with normal exam  Hot flashes, night sweats  Menorrhagia, dysmenorrhea much better with the IUD  Screening STD  IUD check, IUD in place  Elevated BP, no h/o htn, will recheck (142/86 and 142/70)  Smoker  P:   No pap this year  Great lipids last year  Screening labs  STD screening  Discussed smoking cessation  Recommended she f/u with primary for her elevated BP, she states it's just because she had a cigarette just prior to her visit  Recommended she come back for a BP check.

## 2015-12-19 NOTE — Patient Instructions (Addendum)
Smoking Cessation, Tips for Success If you are ready to quit smoking, congratulations! You have chosen to help yourself be healthier. Cigarettes bring nicotine, tar, carbon monoxide, and other irritants into your body. Your lungs, heart, and blood vessels will be able to work better without these poisons. There are many different ways to quit smoking. Nicotine gum, nicotine patches, a nicotine inhaler, or nicotine nasal spray can help with physical craving. Hypnosis, support groups, and medicines help break the habit of smoking. WHAT THINGS CAN I DO TO MAKE QUITTING EASIER?  Here are some tips to help you quit for good:  Pick a date when you will quit smoking completely. Tell all of your friends and family about your plan to quit on that date.  Do not try to slowly cut down on the number of cigarettes you are smoking. Pick a quit date and quit smoking completely starting on that day.  Throw away all cigarettes.   Clean and remove all ashtrays from your home, work, and car.  On a card, write down your reasons for quitting. Carry the card with you and read it when you get the urge to smoke.  Cleanse your body of nicotine. Drink enough water and fluids to keep your urine clear or pale yellow. Do this after quitting to flush the nicotine from your body.  Learn to predict your moods. Do not let a bad situation be your excuse to have a cigarette. Some situations in your life might tempt you into wanting a cigarette.  Never have "just one" cigarette. It leads to wanting another and another. Remind yourself of your decision to quit.  Change habits associated with smoking. If you smoked while driving or when feeling stressed, try other activities to replace smoking. Stand up when drinking your coffee. Brush your teeth after eating. Sit in a different chair when you read the paper. Avoid alcohol while trying to quit, and try to drink fewer caffeinated beverages. Alcohol and caffeine may urge you to  smoke.  Avoid foods and drinks that can trigger a desire to smoke, such as sugary or spicy foods and alcohol.  Ask people who smoke not to smoke around you.  Have something planned to do right after eating or having a cup of coffee. For example, plan to take a walk or exercise.  Try a relaxation exercise to calm you down and decrease your stress. Remember, you may be tense and nervous for the first 2 weeks after you quit, but this will pass.  Find new activities to keep your hands busy. Play with a pen, coin, or rubber band. Doodle or draw things on paper.  Brush your teeth right after eating. This will help cut down on the craving for the taste of tobacco after meals. You can also try mouthwash.   Use oral substitutes in place of cigarettes. Try using lemon drops, carrots, cinnamon sticks, or chewing gum. Keep them handy so they are available when you have the urge to smoke.  When you have the urge to smoke, try deep breathing.  Designate your home as a nonsmoking area.  If you are a heavy smoker, ask your health care provider about a prescription for nicotine chewing gum. It can ease your withdrawal from nicotine.  Reward yourself. Set aside the cigarette money you save and buy yourself something nice.  Look for support from others. Join a support group or smoking cessation program. Ask someone at home or at work to help you with your plan   to quit smoking.  Always ask yourself, "Do I need this cigarette or is this just a reflex?" Tell yourself, "Today, I choose not to smoke," or "I do not want to smoke." You are reminding yourself of your decision to quit.  Do not replace cigarette smoking with electronic cigarettes (commonly called e-cigarettes). The safety of e-cigarettes is unknown, and some may contain harmful chemicals.  If you relapse, do not give up! Plan ahead and think about what you will do the next time you get the urge to smoke. HOW WILL I FEEL WHEN I QUIT SMOKING? You  may have symptoms of withdrawal because your body is used to nicotine (the addictive substance in cigarettes). You may crave cigarettes, be irritable, feel very hungry, cough often, get headaches, or have difficulty concentrating. The withdrawal symptoms are only temporary. They are strongest when you first quit but will go away within 10-14 days. When withdrawal symptoms occur, stay in control. Think about your reasons for quitting. Remind yourself that these are signs that your body is healing and getting used to being without cigarettes. Remember that withdrawal symptoms are easier to treat than the major diseases that smoking can cause.  Even after the withdrawal is over, expect periodic urges to smoke. However, these cravings are generally short lived and will go away whether you smoke or not. Do not smoke! WHAT RESOURCES ARE AVAILABLE TO HELP ME QUIT SMOKING? Your health care provider can direct you to community resources or hospitals for support, which may include:  Group support.  Education.  Hypnosis.  Therapy.   This information is not intended to replace advice given to you by your health care provider. Make sure you discuss any questions you have with your health care provider.   Document Released: 12/21/2003 Document Revised: 04/14/2014 Document Reviewed: 09/09/2012 Elsevier Interactive Patient Education 2016 [place] AND DIET:  We recommended that you start or continue a regular exercise program for good health. Regular exercise means any activity that makes your heart beat faster and makes you sweat.  We recommend exercising at least 30 minutes per day at least 3 days a week, preferably 4 or 5.  We also recommend a diet low in fat and sugar.  Inactivity, poor dietary choices and obesity can cause diabetes, heart attack, stroke, and kidney damage, among others.    ALCOHOL AND SMOKING:  Women should limit their alcohol intake to no more than 7 drinks/beers/glasses of  wine (combined, not each!) per week. Moderation of alcohol intake to this level decreases your risk of breast cancer and liver damage. And of course, no recreational drugs are part of a healthy lifestyle.  And absolutely no smoking or even second hand smoke. Most people know smoking can cause heart and lung diseases, but did you know it also contributes to weakening of your bones? Aging of your skin?  Yellowing of your teeth and nails?  CALCIUM AND VITAMIN D:  Adequate intake of calcium and Vitamin D are recommended.  The recommendations for exact amounts of these supplements seem to change often, but generally speaking 600 mg of calcium (either carbonate or citrate) and 800 units of Vitamin D per day seems prudent. Certain women may benefit from higher intake of Vitamin D.  If you are among these women, your doctor will have told you during your visit.    PAP SMEARS:  Pap smears, to check for cervical cancer or precancers,  have traditionally been done yearly, although recent scientific advances have  shown that most women can have pap smears less often.  However, every woman still should have a physical exam from her gynecologist every year. It will include a breast check, inspection of the vulva and vagina to check for abnormal growths or skin changes, a visual exam of the cervix, and then an exam to evaluate the size and shape of the uterus and ovaries.  And after 49 years of age, a rectal exam is indicated to check for rectal cancers. We will also provide age appropriate advice regarding health maintenance, like when you should have certain vaccines, screening for sexually transmitted diseases, bone density testing, colonoscopy, mammograms, etc.   MAMMOGRAMS:  All women over 32 years old should have a yearly mammogram. Many facilities now offer a "3D" mammogram, which may cost around $50 extra out of pocket. If possible,  we recommend you accept the option to have the 3D mammogram performed.  It both  reduces the number of women who will be called back for extra views which then turn out to be normal, and it is better than the routine mammogram at detecting truly abnormal areas.    COLONOSCOPY:  Colonoscopy to screen for colon cancer is recommended for all women at age 54.  We know, you hate the idea of the prep.  We agree, BUT, having colon cancer and not knowing it is worse!!  Colon cancer so often starts as a polyp that can be seen and removed at colonscopy, which can quite literally save your life!  And if your first colonoscopy is normal and you have no family history of colon cancer, most women don't have to have it again for 10 years.  Once every ten years, you can do something that may end up saving your life, right?  We will be happy to help you get it scheduled when you are ready.  Be sure to check your insurance coverage so you understand how much it will cost.  It may be covered as a preventative service at no cost, but you should check your particular policy.

## 2015-12-20 ENCOUNTER — Telehealth: Payer: Self-pay | Admitting: Obstetrics and Gynecology

## 2015-12-20 LAB — COMPREHENSIVE METABOLIC PANEL
ALBUMIN: 4.7 g/dL (ref 3.6–5.1)
ALK PHOS: 66 U/L (ref 33–115)
ALT: 12 U/L (ref 6–29)
AST: 17 U/L (ref 10–35)
BILIRUBIN TOTAL: 0.4 mg/dL (ref 0.2–1.2)
BUN: 13 mg/dL (ref 7–25)
CALCIUM: 9.6 mg/dL (ref 8.6–10.2)
CO2: 24 mmol/L (ref 20–31)
CREATININE: 0.84 mg/dL (ref 0.50–1.10)
Chloride: 107 mmol/L (ref 98–110)
Glucose, Bld: 89 mg/dL (ref 65–99)
Potassium: 4.3 mmol/L (ref 3.5–5.3)
Sodium: 141 mmol/L (ref 135–146)
TOTAL PROTEIN: 7.5 g/dL (ref 6.1–8.1)

## 2015-12-20 LAB — TSH: TSH: 0.92 m[IU]/L

## 2015-12-20 LAB — STD PANEL
HEP B S AG: NEGATIVE
HIV 1&2 Ab, 4th Generation: NONREACTIVE

## 2015-12-20 LAB — GC/CHLAMYDIA PROBE AMP
CT PROBE, AMP APTIMA: NOT DETECTED
GC Probe RNA: NOT DETECTED

## 2015-12-20 LAB — HEPATITIS C ANTIBODY: HCV Ab: NEGATIVE

## 2015-12-20 LAB — VITAMIN D 25 HYDROXY (VIT D DEFICIENCY, FRACTURES): VIT D 25 HYDROXY: 35 ng/mL (ref 30–100)

## 2015-12-20 NOTE — Telephone Encounter (Signed)
Called patient and left a message to call back to schedule a blood pressure check per Dr. Talbert Nan.   Please schedule patient if Barbara [name] not available.

## 2015-12-27 NOTE — Telephone Encounter (Signed)
Called patient and spoke with her to schedule an appointment for a blood pressure check. Patient said, "My blood pressure is fine. I don't need an appointment." I explained the doctor recommends this visit for her but she declined to schedule an appointment.  Routing to provider for review.

## 2016-12-24 ENCOUNTER — Ambulatory Visit: Payer: BLUE CROSS/BLUE SHIELD | Admitting: Obstetrics and Gynecology

## 2017-10-14 IMAGING — MR MR PELVIS WO/W CM
6 of 10 series · 25 of 48 positions shown · IV contrast (15ml multihance)
Comparison: None.

CLINICAL DATA: Subsequent encounter for uterine mass seen on recent
ultrasound exam.

EXAM:
MRI PELVIS WITHOUT AND WITH CONTRAST
TECHNIQUE: Multiplanar multisequence MR imaging of the pelvis was performed
both before and after administration of intravenous contrast.
CONTRAST:  15mL MULTIHANCE GADOBENATE DIMEGLUMINE 529 MG/ML IV SOLN

[Series 2: t2_tse_sag · sagittal · 4.0mm · 0.65mm/px · 6 of 27 slices shown]
[im 1/27]
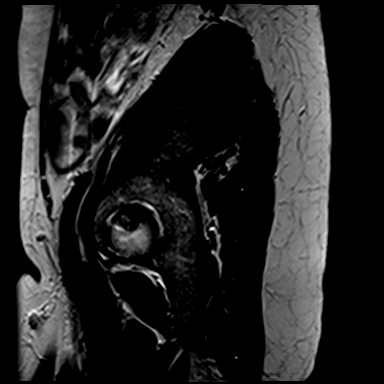
[im 6/27]
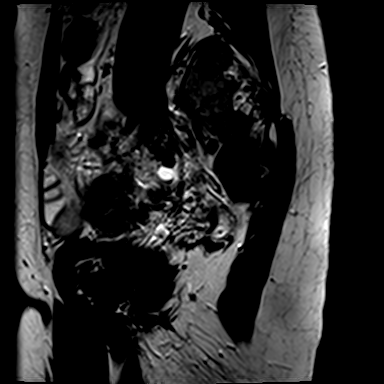
[im 11/27]
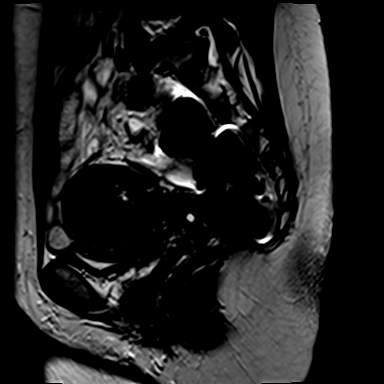
[im 16/27]
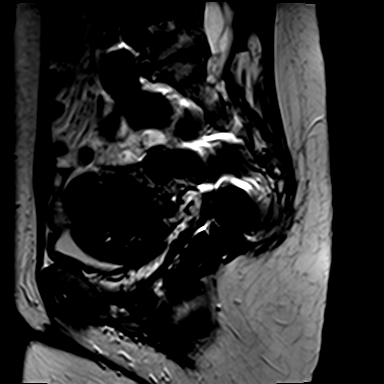
[im 21/27]
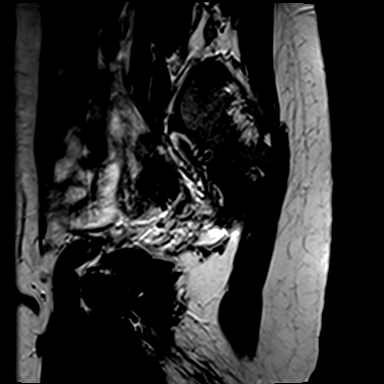
[im 27/27]
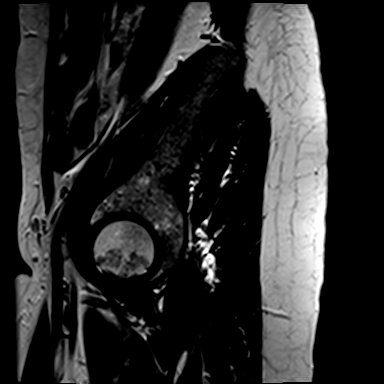

[Series 3: T2 · coronal · 5.0mm · 0.59mm/px · 6 of 27 slices shown]
[im 1/27]
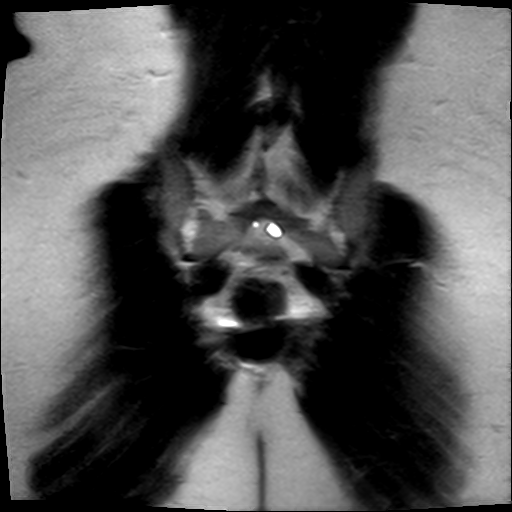
[im 6/27]
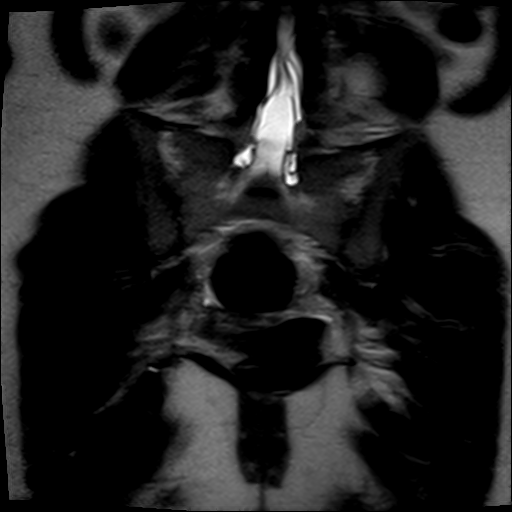
[im 11/27]
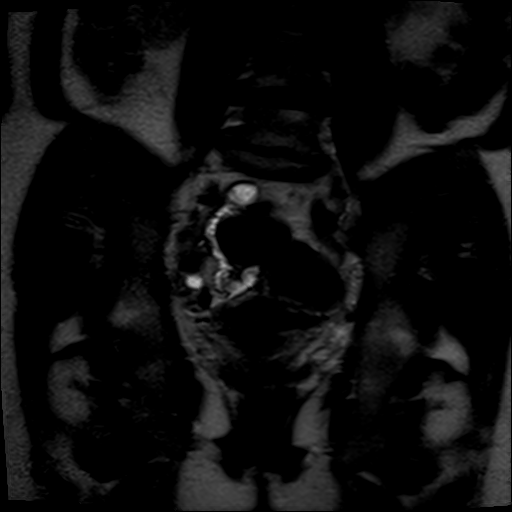
[im 16/27]
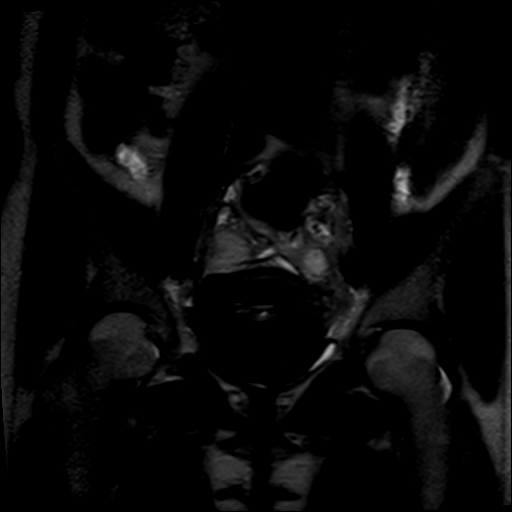
[im 21/27]
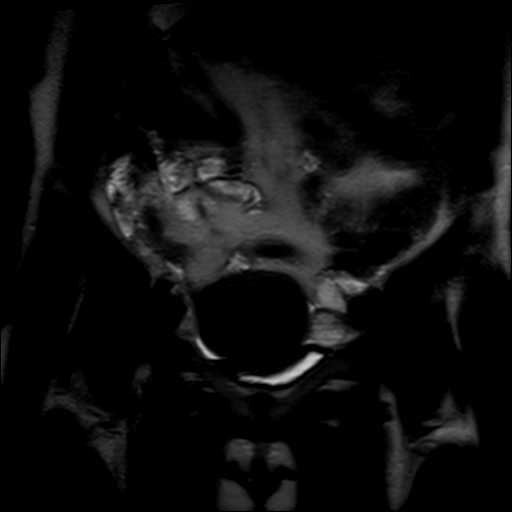
[im 27/27]
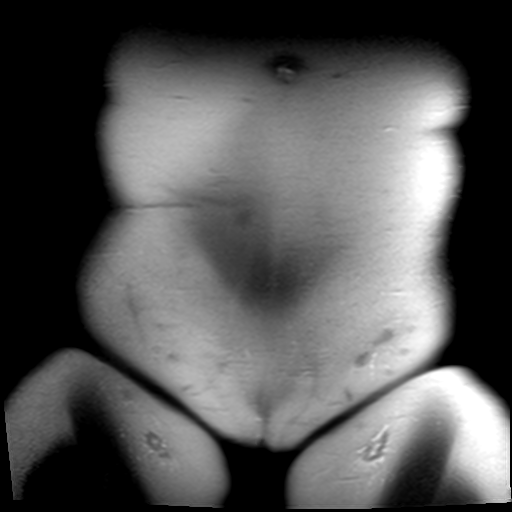

[Series 5: axial spgr · axial · 6.0mm · 0.49mm/px · z∈[-45,+91]mm · 4 of 20 slices shown]
[im 1/20]
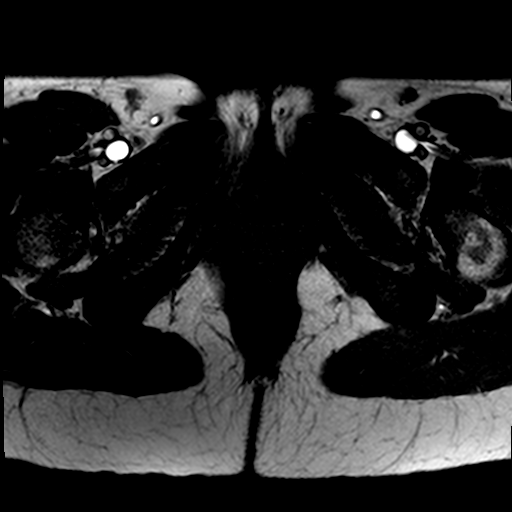
[im 7/20]
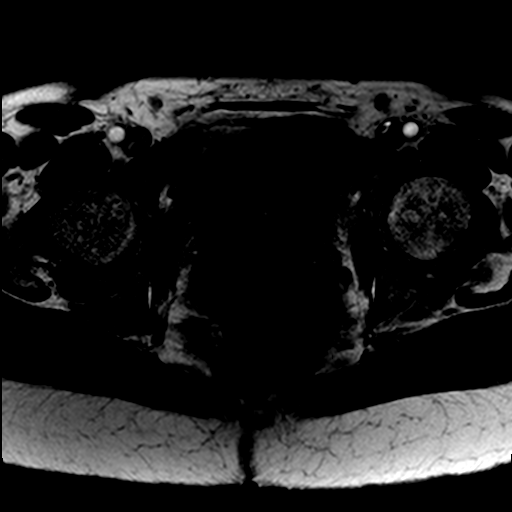
[im 13/20]
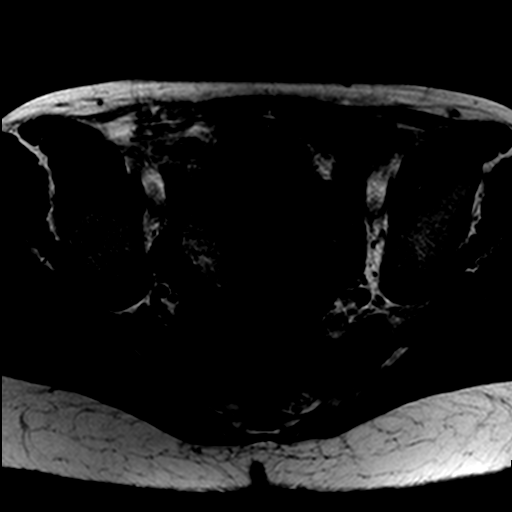
[im 20/20]
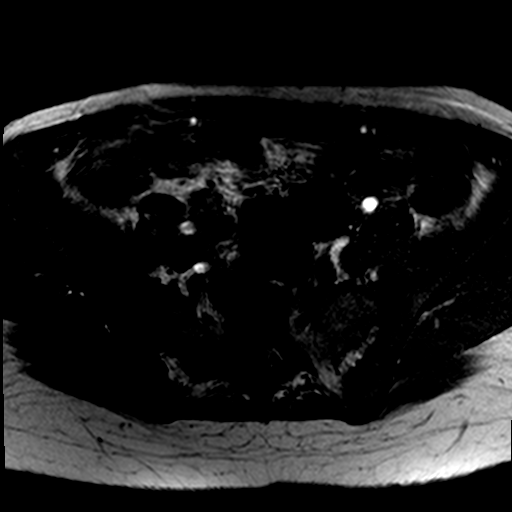

[Series 6: t2_tse axial · axial · 6.0mm · 0.62mm/px · z∈[-46,+91]mm · 4 of 20 slices shown]
[im 1/20]
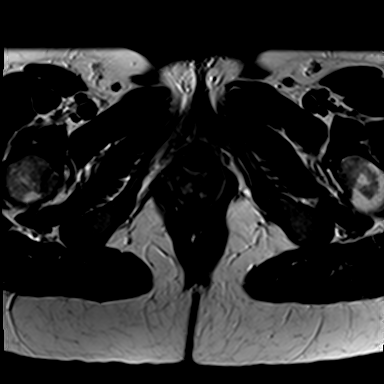
[im 7/20]
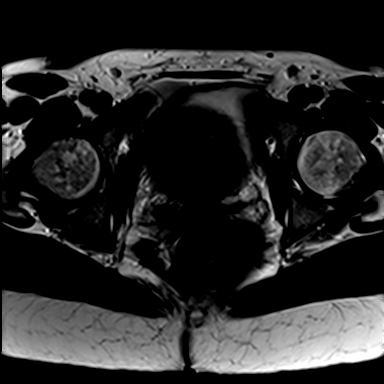
[im 13/20]
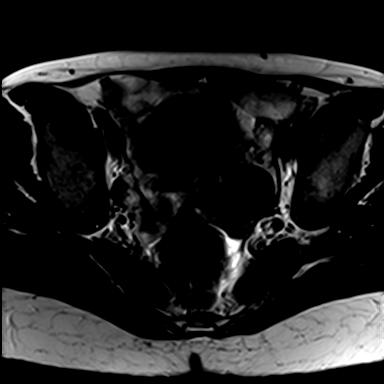
[im 20/20]
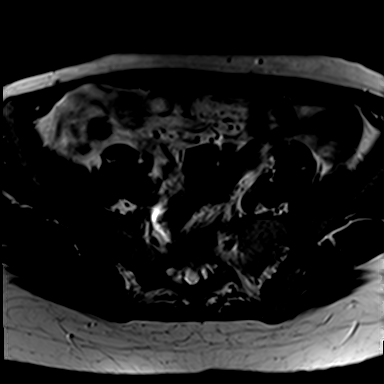

[Series 7: t2_tse axial fs · axial · 6.0mm · 0.62mm/px · z∈[-46,+91]mm · 4 of 20 slices shown]
[im 1/20]
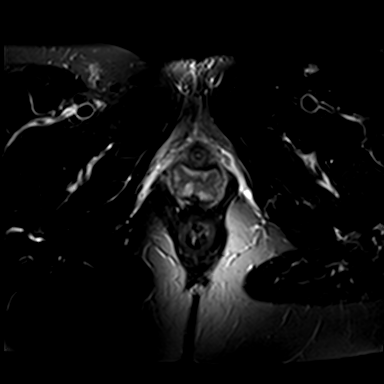
[im 7/20]
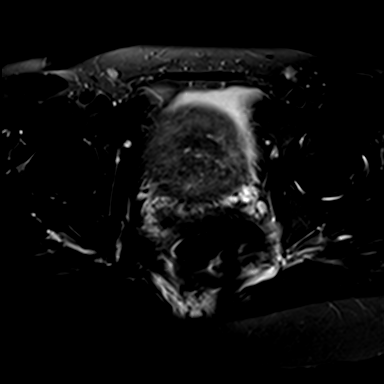
[im 13/20]
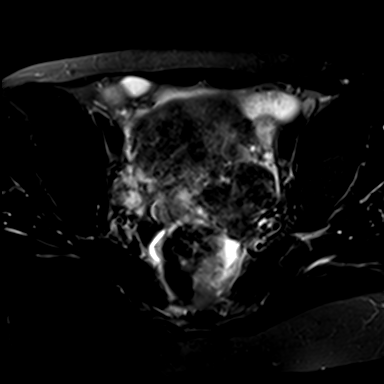
[im 20/20]
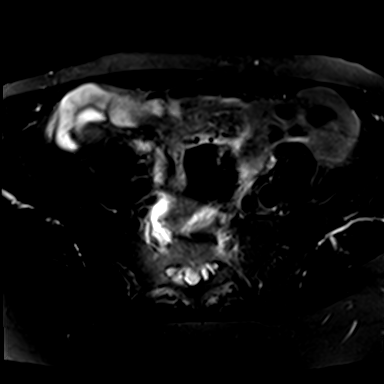

[Series 8: axial spgr pre · axial · non-contrast · 6.0mm · 0.49mm/px · 1 of 20 slices shown]
[im 1/20]
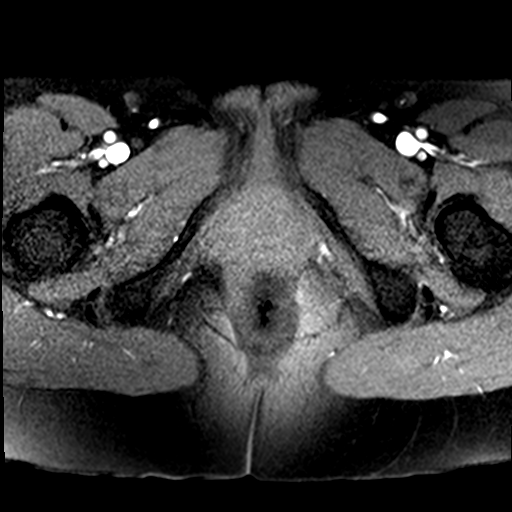

[25 of 48 positions shown; findings below may reference images not displayed]

FINDINGS: Uterus measures 10.2 x 6.9 x 8.3 cm. 5.0 x 4.2 x 4.2 cm exophytic
fibroid extends posteriorly to the left from the uterine body,
extending into the left adnexal space. Sagittal T2 imaging shows
marked thickening of the junctional zone, especially anterior uterus
extending up into the fundus. Scattered areas of punctate T2
hyperintensity are seen within the expanded junctional zone. The
posterior junctional zone is relatively spared.

Right ovary measures 2.7 x 3.8 x 2.1 cm and has normal MR imaging
features. Left ovary measures 2.5 x 3.2 x 1.9 cm and is also normal.

Bladder is nondistended.  The urethra has normal imaging features.

No intraperitoneal free fluid. No pelvic sidewall lymphadenopathy.
No abnormal marrow signal.
IMPRESSION: 1. Advanced changes of focal segmental adenomyosis mainly involving
the anterior uterine body with some extension up into the fundal
region.
2. Posterior exophytic left-sided fibroid measuring up to 5.0 cm.
3. Normal MR appearance of the ovaries.

## 2018-03-10 ENCOUNTER — Encounter (HOSPITAL_COMMUNITY): Payer: Self-pay | Admitting: Emergency Medicine

## 2018-03-10 ENCOUNTER — Emergency Department (HOSPITAL_COMMUNITY): Payer: Self-pay

## 2018-03-10 ENCOUNTER — Emergency Department (HOSPITAL_COMMUNITY)
Admission: EM | Admit: 2018-03-10 | Discharge: 2018-03-10 | Disposition: A | Discharge status: Home / Self Care | Payer: Self-pay | Attending: Emergency Medicine | Admitting: Emergency Medicine

## 2018-03-10 ENCOUNTER — Other Ambulatory Visit: Payer: Self-pay

## 2018-03-10 DIAGNOSIS — Z5321 Procedure and treatment not carried out due to patient leaving prior to being seen by health care provider: Secondary | ICD-10-CM | POA: Insufficient documentation

## 2018-03-10 DIAGNOSIS — R6884 Jaw pain: Secondary | ICD-10-CM | POA: Insufficient documentation

## 2018-03-10 NOTE — ED Triage Notes (Signed)
Patient reports she was yawning and her jaw locked. Has been unable to open her mouth since Monday.

## 2018-03-10 NOTE — ED Notes (Signed)
Patient called for room, no answer. 

## 2018-03-10 NOTE — ED Notes (Signed)
Called for second time, no response

## 2018-11-08 ENCOUNTER — Emergency Department (HOSPITAL_COMMUNITY)
Admission: EM | Admit: 2018-11-08 | Discharge: 2018-11-08 | Disposition: A | Discharge status: Home / Self Care | Payer: Self-pay | Attending: Emergency Medicine | Admitting: Emergency Medicine

## 2018-11-08 ENCOUNTER — Other Ambulatory Visit: Payer: Self-pay

## 2018-11-08 ENCOUNTER — Encounter (HOSPITAL_COMMUNITY): Payer: Self-pay | Admitting: *Deleted

## 2018-11-08 DIAGNOSIS — F17210 Nicotine dependence, cigarettes, uncomplicated: Secondary | ICD-10-CM | POA: Insufficient documentation

## 2018-11-08 DIAGNOSIS — Z79899 Other long term (current) drug therapy: Secondary | ICD-10-CM | POA: Insufficient documentation

## 2018-11-08 DIAGNOSIS — I10 Essential (primary) hypertension: Secondary | ICD-10-CM | POA: Insufficient documentation

## 2018-11-08 NOTE — ED Provider Notes (Signed)
[facility] EMERGENCY DEPARTMENT Provider Note   CSN: [id number] Arrival date & time: 11/08/18  0506  Time seen 6:10 AM  History   Chief Complaint Chief Complaint  Patient presents with  . Hypertension    HPI Barbara [name] is a 53 y.o. female.     HPI patient states she went to a work interview on July 31 and was told her blood pressure was too high.  Her blood pressure was 165/86, 171/100, 165/100.  She states she was very excited about the job.  They told her to go to her primary care doctor to get released back to work.  Patient denies headaches, chest pain, shortness of breath, swelling in her extremities, or any visual changes.  PCP Patient, No Pcp Per   Past Medical History:  Diagnosis Date  . Arthritis    hands, knees, lower back  . Dysmenorrhea   . History of chlamydia   . IBS (irritable bowel syndrome)   . Menorrhagia   . Smoker   . Stuttering     Patient Active Problem List   Diagnosis Date Noted  . Vitamin D deficiency 12/19/2015  . Smoker   . Menorrhagia   . Dysmenorrhea     Past Surgical History:  Procedure Laterality Date  . APPENDECTOMY    . DILATION AND CURETTAGE OF UTERUS    . ENDOMETRIAL ABLATION    . lt foot surgery    . TUBAL LIGATION       OB History    Gravida  3   Para  2   Term      Preterm      AB  1   Living  2     SAB  1   TAB      Ectopic      Multiple      Live Births               Home Medications    Prior to Admission medications   Medication Sig Start Date End Date Taking? Authorizing Provider  levonorgestrel (MIRENA) 20 MCG/24HR IUD 1 each by Intrauterine route once.    [provider]    Family History Family History  Problem Relation Age of Onset  . Hypertension Mother   . Osteoporosis Mother   . Osteoporosis Maternal Grandmother   . Hypertension Maternal Grandmother   . Heart disease Maternal Grandmother   . Cancer Father        stomach  . Diabetes Maternal Grandfather    . Osteoporosis Maternal Grandfather     Social History Social History   Tobacco Use  . Smoking status: Current Every Day Smoker    Packs/day: 1.00    Types: Cigarettes  . Smokeless tobacco: Never Used  Substance Use Topics  . Alcohol use: No    Alcohol/week: 0.0 standard drinks  . Drug use: No    Types: Marijuana    Comment: when on her cycle- no loner smoking marijuana 12-07-14      Allergies   Penicillins   Review of Systems Review of Systems  All other systems reviewed and are negative.    Physical Exam Updated Vital Signs BP 127/89 (BP Location: Left Arm)   Pulse 89   Temp (!) 97.5 F (36.4 C) (Oral)   Resp 16   Ht 5\' 11"  (1.803 m)   Wt 83.9 kg   LMP 12/13/2015   SpO2 95%   BMI 25.80 kg/m   Physical Exam Vitals signs  and nursing note reviewed.  Constitutional:      Appearance: Normal appearance. She is normal weight.  HENT:     Head: Normocephalic and atraumatic.     Right Ear: External ear normal.     Left Ear: External ear normal.     Mouth/Throat:     Mouth: Mucous membranes are moist.     Comments: Patient has significant stutter or speech impediment Eyes:     Extraocular Movements: Extraocular movements intact.     Conjunctiva/sclera: Conjunctivae normal.     Pupils: Pupils are equal, round, and reactive to light.  Neck:     Musculoskeletal: Normal range of motion.  Cardiovascular:     Rate and Rhythm: Normal rate and regular rhythm.     Heart sounds: Normal heart sounds.  Pulmonary:     Effort: Pulmonary effort is normal. No respiratory distress.     Breath sounds: Normal breath sounds.  Musculoskeletal: Normal range of motion.        General: No swelling.  Skin:    General: Skin is warm and dry.     Findings: No erythema.  Neurological:     General: No focal deficit present.     Mental Status: She is alert and oriented to person, place, and time.     Cranial Nerves: No cranial nerve deficit.  Psychiatric:        Mood and Affect:  Mood normal.        Behavior: Behavior normal.        Thought Content: Thought content normal.      ED Treatments / Results  Labs (all labs ordered are listed, but only abnormal results are displayed) Labs Reviewed - No data to display  EKG None  Radiology No results found.  Procedures Procedures (including critical care time)  Medications Ordered in ED Medications - No data to display   Initial Impression / Assessment and Plan / ED Course  I have reviewed the triage vital signs and the nursing notes.  Pertinent labs & imaging results that were available during my care of the patient were reviewed by me and considered in my medical decision making (see chart for details).    Patient's blood pressure is normal in the ED.  Patient has 3 pages of paperwork she wants me to fill out to have her released back to work.  I have to determine if she needs limited restrictions and what she can do at work.  She was advised she needs to go to the urgent care where they do occupational medicine to have that filled out.  Final Clinical Impressions(s) / ED Diagnoses   Final diagnoses:  Hypertension, unspecified type    ED Discharge Orders    None      Plan discharge  Rolland Porter, MD, Barbette Or, MD 11/08/18 [phone]

## 2018-11-08 NOTE — ED Triage Notes (Signed)
Pt states she went for a premedical screening at her place of employment and was told she had high blood pressure and needed to be seen by a PCP before she could come to work for the company

## 2018-11-08 NOTE — Discharge Instructions (Addendum)
You can go to either one of the urgent cares listed above to have them do your work assessment.  Your blood pressure was normal in the emergency department today.  Your blood pressure was 127/89.

## 2020-02-07 ENCOUNTER — Encounter: Payer: Self-pay | Admitting: Internal Medicine

## 2020-02-07 ENCOUNTER — Other Ambulatory Visit (HOSPITAL_COMMUNITY): Payer: Self-pay | Admitting: Family Medicine

## 2020-02-07 DIAGNOSIS — Z1231 Encounter for screening mammogram for malignant neoplasm of breast: Secondary | ICD-10-CM

## 2020-03-08 ENCOUNTER — Ambulatory Visit: Payer: Self-pay

## 2020-03-08 ENCOUNTER — Encounter: Payer: Self-pay | Admitting: Internal Medicine

## 2020-03-08 ENCOUNTER — Other Ambulatory Visit: Payer: Self-pay

## 2021-10-04 ENCOUNTER — Other Ambulatory Visit (HOSPITAL_COMMUNITY): Payer: Self-pay | Admitting: Family Medicine

## 2021-10-04 DIAGNOSIS — Z1231 Encounter for screening mammogram for malignant neoplasm of breast: Secondary | ICD-10-CM

## 2021-10-07 ENCOUNTER — Other Ambulatory Visit (HOSPITAL_COMMUNITY): Payer: Self-pay | Admitting: Family Medicine

## 2021-10-07 DIAGNOSIS — R079 Chest pain, unspecified: Secondary | ICD-10-CM

## 2021-10-15 ENCOUNTER — Encounter: Payer: Self-pay | Admitting: *Deleted

## 2021-10-17 ENCOUNTER — Ambulatory Visit (HOSPITAL_COMMUNITY)
Admission: RE | Admit: 2021-10-17 | Discharge: 2021-10-17 | Disposition: A | Discharge status: Home / Self Care | Payer: BLUE CROSS/BLUE SHIELD | Source: Ambulatory Visit | Attending: Family Medicine | Admitting: Family Medicine

## 2021-10-17 DIAGNOSIS — R079 Chest pain, unspecified: Secondary | ICD-10-CM | POA: Insufficient documentation

## 2021-10-17 LAB — ECHOCARDIOGRAM COMPLETE
Area-P 1/2: 3.48 cm2
S' Lateral: 2.8 cm

## 2021-10-17 NOTE — Progress Notes (Signed)
*  PRELIMINARY RESULTS* Echocardiogram 2D Echocardiogram has been performed.  Barbara [name] 10/17/2021, 10:13 AM

## 2021-11-14 ENCOUNTER — Ambulatory Visit (HOSPITAL_COMMUNITY)
Admission: RE | Admit: 2021-11-14 | Discharge: 2021-11-14 | Disposition: A | Discharge status: Home / Self Care | Payer: BLUE CROSS/BLUE SHIELD | Source: Ambulatory Visit | Attending: Family Medicine | Admitting: Family Medicine

## 2021-11-14 DIAGNOSIS — Z1231 Encounter for screening mammogram for malignant neoplasm of breast: Secondary | ICD-10-CM | POA: Diagnosis present

## 2021-11-15 ENCOUNTER — Other Ambulatory Visit (HOSPITAL_COMMUNITY): Payer: Self-pay | Admitting: Family Medicine

## 2021-11-18 ENCOUNTER — Other Ambulatory Visit (HOSPITAL_COMMUNITY): Payer: Self-pay | Admitting: Family Medicine

## 2021-11-18 DIAGNOSIS — R928 Other abnormal and inconclusive findings on diagnostic imaging of breast: Secondary | ICD-10-CM

## 2021-12-10 ENCOUNTER — Ambulatory Visit (HOSPITAL_COMMUNITY)
Admission: RE | Admit: 2021-12-10 | Discharge: 2021-12-10 | Disposition: A | Discharge status: Home / Self Care | Payer: Self-pay | Source: Ambulatory Visit | Attending: Family Medicine | Admitting: Family Medicine

## 2021-12-10 DIAGNOSIS — R928 Other abnormal and inconclusive findings on diagnostic imaging of breast: Secondary | ICD-10-CM | POA: Insufficient documentation

## 2022-04-15 ENCOUNTER — Encounter: Payer: Self-pay | Admitting: *Deleted

## 2022-10-08 ENCOUNTER — Other Ambulatory Visit (HOSPITAL_COMMUNITY): Payer: Self-pay | Admitting: Family Medicine

## 2022-10-08 DIAGNOSIS — I10 Essential (primary) hypertension: Secondary | ICD-10-CM | POA: Diagnosis not present

## 2022-10-08 DIAGNOSIS — Z1231 Encounter for screening mammogram for malignant neoplasm of breast: Secondary | ICD-10-CM

## 2022-10-08 DIAGNOSIS — Z30432 Encounter for removal of intrauterine contraceptive device: Secondary | ICD-10-CM | POA: Diagnosis not present

## 2022-10-08 DIAGNOSIS — Z13220 Encounter for screening for lipoid disorders: Secondary | ICD-10-CM | POA: Diagnosis not present

## 2022-10-08 DIAGNOSIS — Z716 Tobacco abuse counseling: Secondary | ICD-10-CM | POA: Diagnosis not present

## 2022-10-08 DIAGNOSIS — Z720 Tobacco use: Secondary | ICD-10-CM | POA: Diagnosis not present

## 2022-10-08 DIAGNOSIS — E669 Obesity, unspecified: Secondary | ICD-10-CM | POA: Diagnosis not present

## 2022-10-08 DIAGNOSIS — Z131 Encounter for screening for diabetes mellitus: Secondary | ICD-10-CM | POA: Diagnosis not present

## 2022-10-08 DIAGNOSIS — Z1239 Encounter for other screening for malignant neoplasm of breast: Secondary | ICD-10-CM | POA: Diagnosis not present

## 2022-10-08 DIAGNOSIS — Z0000 Encounter for general adult medical examination without abnormal findings: Secondary | ICD-10-CM | POA: Diagnosis not present

## 2022-10-08 DIAGNOSIS — Z975 Presence of (intrauterine) contraceptive device: Secondary | ICD-10-CM | POA: Diagnosis not present

## 2022-11-12 DIAGNOSIS — Z716 Tobacco abuse counseling: Secondary | ICD-10-CM | POA: Diagnosis not present

## 2022-11-12 DIAGNOSIS — Z720 Tobacco use: Secondary | ICD-10-CM | POA: Diagnosis not present

## 2022-11-12 DIAGNOSIS — I10 Essential (primary) hypertension: Secondary | ICD-10-CM | POA: Diagnosis not present

## 2022-11-12 DIAGNOSIS — E669 Obesity, unspecified: Secondary | ICD-10-CM | POA: Diagnosis not present

## 2022-11-17 ENCOUNTER — Ambulatory Visit (HOSPITAL_COMMUNITY): Payer: BLUE CROSS/BLUE SHIELD

## 2023-11-26 ENCOUNTER — Encounter (INDEPENDENT_AMBULATORY_CARE_PROVIDER_SITE_OTHER): Payer: Self-pay | Admitting: *Deleted

## 2023-11-27 ENCOUNTER — Other Ambulatory Visit (HOSPITAL_COMMUNITY): Payer: Self-pay | Admitting: Family Medicine

## 2023-11-27 DIAGNOSIS — Z1231 Encounter for screening mammogram for malignant neoplasm of breast: Secondary | ICD-10-CM

## 2023-12-03 ENCOUNTER — Encounter (HOSPITAL_COMMUNITY): Payer: Self-pay

## 2023-12-03 ENCOUNTER — Ambulatory Visit (HOSPITAL_COMMUNITY)
Admission: RE | Admit: 2023-12-03 | Discharge: 2023-12-03 | Disposition: A | Discharge status: Home / Self Care | Source: Ambulatory Visit | Attending: Family Medicine | Admitting: Family Medicine

## 2023-12-03 DIAGNOSIS — Z1231 Encounter for screening mammogram for malignant neoplasm of breast: Secondary | ICD-10-CM | POA: Diagnosis present

## 2023-12-22 ENCOUNTER — Telehealth: Payer: Self-pay

## 2023-12-22 NOTE — Telephone Encounter (Signed)
 Who is your primary care physician: Mariano Lindau  Reasons for the colonoscopy: [facility]   Have you had a colonoscopy before?  Yes Arlean Salmon 2013 or 2013  Do you have family history of colon cancer? no  Previous colonoscopy with polyps removed? yes  Do you have a history colorectal cancer?   no  Are you diabetic? If yes, Type 1 or Type 2?    no  Do you have a prosthetic or mechanical heart valve? no  Do you have a pacemaker/defibrillator?   no  Have you had endocarditis/atrial fibrillation? no  Have you had joint replacement within the last 12 months?  no  Do you tend to be constipated or have to use laxatives? no  Do you have any history of drugs or alchohol?  no  Do you use supplemental oxygen?  no  Have you had a stroke or heart attack within the last 6 months? Not answered  Do you take weight loss medication?  no  For female patients: have you had a hysterectomy?  no                                     are you post menopausal?       yes                                            do you still have your menstrual cycle? no      Do you take any blood-thinning medications such as: (aspirin, warfarin, Plavix, Aggrenox)  no  If yes we need the name, milligram, dosage and who is prescribing doctor  Current Outpatient Medications on File Prior to Visit  Medication Sig Dispense Refill   amLODipine (NORVASC) 5 MG tablet Take 5 mg by mouth daily.     No current facility-administered medications on file prior to visit.    Allergies  Allergen Reactions   Penicillins Swelling and Rash    Has patient had a PCN reaction causing immediate rash, facial/tongue/throat swelling, SOB or lightheadedness with hypotension: Yes Has patient had a PCN reaction causing severe rash involving mucus membranes or skin necrosis: No Has patient had a PCN reaction that required hospitalization No Has patient had a PCN reaction occurring within the last 10 years: No If all of the above answers  are NO, then may proceed with Cephalosporin use.      [facility]  Primary Insurance Name: [employer] [id number]  Best number where you can be reached: [phone]

## 2024-01-01 NOTE — Telephone Encounter (Signed)
Asa 2

## 2024-01-04 ENCOUNTER — Other Ambulatory Visit: Payer: Self-pay | Admitting: *Deleted

## 2024-01-04 ENCOUNTER — Encounter (INDEPENDENT_AMBULATORY_CARE_PROVIDER_SITE_OTHER): Payer: Self-pay | Admitting: *Deleted

## 2024-01-04 ENCOUNTER — Encounter: Payer: Self-pay | Admitting: *Deleted

## 2024-01-04 MED ORDER — PEG 3350-KCL-NA BICARB-NACL 420 G PO SOLR: 4000.0000 mL | Freq: Once | ORAL | 0 refills | Status: AC

## 2024-01-04 NOTE — Telephone Encounter (Signed)
 Pt has been scheduled for 01/29/24 with Dr.Carver. instructions mailed and prep sent to pharmacy.

## 2024-01-04 NOTE — Telephone Encounter (Signed)
 Referral completed, [employer] apt letter sent to PCP

## 2024-01-29 ENCOUNTER — Ambulatory Visit (HOSPITAL_COMMUNITY)
Admission: RE | Admit: 2024-01-29 | Discharge: 2024-01-29 | Disposition: A | Discharge status: Home / Self Care | Attending: Internal Medicine | Admitting: Internal Medicine

## 2024-01-29 ENCOUNTER — Encounter (HOSPITAL_COMMUNITY): Payer: Self-pay | Admitting: Internal Medicine

## 2024-01-29 ENCOUNTER — Other Ambulatory Visit: Payer: Self-pay

## 2024-01-29 ENCOUNTER — Encounter (HOSPITAL_COMMUNITY): Admission: RE | Disposition: A | Payer: Self-pay | Source: Home / Self Care | Attending: Internal Medicine

## 2024-01-29 ENCOUNTER — Ambulatory Visit (HOSPITAL_COMMUNITY): Admitting: Anesthesiology

## 2024-01-29 DIAGNOSIS — D125 Benign neoplasm of sigmoid colon: Secondary | ICD-10-CM | POA: Diagnosis not present

## 2024-01-29 DIAGNOSIS — Z1211 Encounter for screening for malignant neoplasm of colon: Secondary | ICD-10-CM | POA: Diagnosis present

## 2024-01-29 DIAGNOSIS — D123 Benign neoplasm of transverse colon: Secondary | ICD-10-CM

## 2024-01-29 DIAGNOSIS — K648 Other hemorrhoids: Secondary | ICD-10-CM | POA: Insufficient documentation

## 2024-01-29 DIAGNOSIS — I10 Essential (primary) hypertension: Secondary | ICD-10-CM | POA: Diagnosis not present

## 2024-01-29 DIAGNOSIS — K635 Polyp of colon: Secondary | ICD-10-CM | POA: Insufficient documentation

## 2024-01-29 DIAGNOSIS — K5730 Diverticulosis of large intestine without perforation or abscess without bleeding: Secondary | ICD-10-CM | POA: Diagnosis not present

## 2024-01-29 DIAGNOSIS — F17210 Nicotine dependence, cigarettes, uncomplicated: Secondary | ICD-10-CM | POA: Diagnosis not present

## 2024-01-29 HISTORY — DX: Essential (primary) hypertension: I10

## 2024-01-29 HISTORY — PX: COLONOSCOPY: SHX5424

## 2024-01-29 SURGERY — COLONOSCOPY
Anesthesia: General

## 2024-01-29 MED ORDER — LIDOCAINE HCL (CARDIAC) PF 100 MG/5ML IV SOSY
PREFILLED_SYRINGE | INTRAVENOUS | Status: DC | PRN
  Administered 2024-01-29: 50 mg via INTRAVENOUS

## 2024-01-29 MED ORDER — LACTATED RINGERS IV SOLN: INTRAVENOUS | Status: DC

## 2024-01-29 MED ORDER — PROPOFOL 10 MG/ML IV BOLUS
INTRAVENOUS | Status: DC | PRN
  Administered 2024-01-29: 100 mg via INTRAVENOUS
  Administered 2024-01-29: 150 ug/kg/min via INTRAVENOUS

## 2024-01-29 NOTE — Op Note (Signed)
 [facility] Patient Name: Barbara [name] Procedure Date: 01/29/2024 10:05 AM MRN: [id number] Date of Birth: 06/02/1965 Attending MD: Carlin POUR. Cindie , [place], [id number] CSN: [id number] Age: 58 Admit Type: Outpatient Procedure:                Colonoscopy Indications:              Screening for colorectal malignant neoplasm Providers:                Carlin POUR. Cindie, DO, [place]vere Lodge, Gordy Lonni Balm, Technician Referring MD:              Medicines:                See the Anesthesia note for documentation of the                            administered medications Complications:            No immediate complications. Estimated Blood Loss:     Estimated blood loss was minimal. Procedure:                Pre-Anesthesia Assessment:                           - The anesthesia plan was to use monitored                            anesthesia care (MAC).                           After obtaining informed consent, the colonoscope                            was passed under direct vision. Throughout the                            procedure, the patient's blood pressure, pulse, and                            oxygen saturations were monitored continuously. The                            PCF-HQ190L ([id number]) Peds Colon was introduced                            through the anus and advanced to the the cecum,                            identified by appendiceal orifice and ileocecal                            valve. The colonoscopy was performed without                            difficulty. The patient tolerated the procedure  well. The quality of the bowel preparation was                            evaluated using the BBPS [facility] Bowel Preparation                            Scale) with scores of: Right Colon = 3, Transverse                            Colon = 3 and Left Colon = 3 (entire mucosa seen                            well with no  residual staining, small fragments of                            stool or opaque liquid). The total BBPS score                            equals 9. Scope In: 10:19:55 AM Scope Out: 10:37:53 AM Scope Withdrawal Time: 0 hours 13 minutes 20 seconds  Total Procedure Duration: 0 hours 17 minutes 58 seconds  Findings:      Non-bleeding internal hemorrhoids were found.      A 5 mm polyp was found in the transverse colon. The polyp was sessile.       The polyp was removed with a cold snare. Resection and retrieval were       complete.      Four sessile polyps were found in the sigmoid colon. The polyps were 3       to 7 mm in size. These polyps were removed with a cold snare. Resection       and retrieval were complete.      A few large-mouthed and small-mouthed diverticula were found in the       descending colon and transverse colon. Impression:               - Non-bleeding internal hemorrhoids.                           - One 5 mm polyp in the transverse colon, removed                            with a cold snare. Resected and retrieved.                           - Four 3 to 7 mm polyps in the sigmoid colon,                            removed with a cold snare. Resected and retrieved.                           - Diverticulosis in the descending colon and in the                            transverse colon. Moderate Sedation:  Per Anesthesia Care Recommendation:           - Patient has a contact number available for                            emergencies. The signs and symptoms of potential                            delayed complications were discussed with the                            patient. Return to normal activities tomorrow.                            Written discharge instructions were provided to the                            patient.                           - Resume previous diet.                           - Continue present medications.                           -  Await pathology results.                           - Repeat colonoscopy date to be determined after                            pending pathology results are reviewed for                            surveillance.                           - Return to GI clinic PRN. Procedure Code(s):        --- Professional ---                           [phone], Colonoscopy, flexible; with removal of                            tumor(s), polyp(s), or other lesion(s) by snare                            technique Diagnosis Code(s):        --- Professional ---                           Z12.11, Encounter for screening for malignant                            neoplasm of colon  D12.3, Benign neoplasm of transverse colon (hepatic                            flexure or splenic flexure)                           D12.5, Benign neoplasm of sigmoid colon                           K64.8, Other hemorrhoids                           K57.30, Diverticulosis of large intestine without                            perforation or abscess without bleeding CPT copyright 2022 American [employer] Association. All rights reserved. The codes documented in this report are preliminary and upon coder review may  be revised to meet current compliance requirements. Carlin POUR. Cindie, DO Carlin POUR. Cindie, DO 01/29/2024 10:46:37 AM This report has been signed electronically. Number of Addenda: 0

## 2024-01-29 NOTE — Anesthesia Postprocedure Evaluation (Signed)
 Anesthesia Post Note  Patient: Barbara [name]  Procedure(s) Performed: COLONOSCOPY  Patient location during evaluation: Phase II Anesthesia Type: General Level of consciousness: awake Pain management: pain level controlled Vital Signs Assessment: post-procedure vital signs reviewed and stable Respiratory status: spontaneous breathing and respiratory function stable Cardiovascular status: blood pressure returned to baseline and stable Postop Assessment: no headache and no apparent nausea or vomiting Anesthetic complications: no Comments: Late entry   No notable events documented.   Last Vitals:  Vitals:   01/29/24 0949 01/29/24 1040  BP: (!) 134/93 105/78  Pulse: 89 78  Resp: 18 16  Temp: 36.8 C 36.6 C  SpO2: 97% 96%    Last Pain:  Vitals:   01/29/24 1044  TempSrc:   PainSc: 0-No pain                 Yvonna JINNY Bosworth

## 2024-01-29 NOTE — H&P (Signed)
 Primary Care Physician:  Halbert Mariano SQUIBB, DO Primary Gastroenterologist:  Dr. Cindie  Pre-Procedure History & Physical: HPI:  Barbara [name] is a 58 y.o. female is here for a colonoscopy for colon cancer screening purposes.  Patient denies any family history of colorectal cancer.  Past Medical History:  Diagnosis Date   Arthritis    hands, knees, lower back   Dysmenorrhea    History of chlamydia    Hypertension    IBS (irritable bowel syndrome)    Menorrhagia    Smoker    Stuttering     Past Surgical History:  Procedure Laterality Date   APPENDECTOMY     DILATION AND CURETTAGE OF UTERUS     ENDOMETRIAL ABLATION     lt foot surgery     TUBAL LIGATION      Prior to Admission medications   Medication Sig Start Date End Date Taking? Authorizing Provider  amLODipine (NORVASC) 5 MG tablet Take 5 mg by mouth daily.   Yes [provider]    Allergies as of 01/04/2024 - Review Complete 12/22/2023  Allergen Reaction Noted   Penicillins Swelling and Rash 09/15/2011    Family History  Problem Relation Age of Onset   Hypertension Mother    Osteoporosis Mother    Osteoporosis Maternal Grandmother    Hypertension Maternal Grandmother    Heart disease Maternal Grandmother    Cancer Father        stomach   Diabetes Maternal Grandfather    Osteoporosis Maternal Grandfather     Social History   Socioeconomic History   Marital status: Single    Spouse name: Not on file   Number of children: Not on file   Years of education: Not on file   Highest education level: Not on file  Occupational History   Not on file  Tobacco Use   Smoking status: Every Day    Current packs/day: 1.00    Types: Cigarettes   Smokeless tobacco: Never  Vaping Use   Vaping status: Never Used  Substance and Sexual Activity   Alcohol use: No    Alcohol/week: 0.0 standard drinks of alcohol   Drug use: No    Types: Marijuana    Comment: when on her cycle- no loner smoking marijuana  12-07-14    Sexual activity: Yes    Partners: Male    Birth control/protection: Surgical    Comment: BTSP 1995  Other Topics Concern   Not on file  Social History Narrative   Not on file   Social Drivers of Health   Financial Resource Strain: Not on file  Food Insecurity: Not on file  Transportation Needs: Not on file  Physical Activity: Not on file  Stress: Not on file  Social Connections: Not on file  Intimate Partner Violence: Not on file    Review of Systems: See HPI, otherwise negative ROS  Physical Exam: Vital signs in last 24 hours: Temp:  [98.3 F (36.8 C)] 98.3 F (36.8 C) (10/24 0949) Pulse Rate:  [89] 89 (10/24 0949) Resp:  [18] 18 (10/24 0949) BP: (134)/(93) 134/93 (10/24 0949) SpO2:  [97 %] 97 % (10/24 0949) Weight:  [107.5 kg] 107.5 kg (10/24 0949)   General:   Alert,  Well-developed, well-nourished, pleasant and cooperative in NAD Head:  Normocephalic and atraumatic. Eyes:  Sclera clear, no icterus.   Conjunctiva pink. Ears:  Normal auditory acuity. Nose:  No deformity, discharge,  or lesions. Msk:  Symmetrical without gross deformities. Normal posture.  Extremities:  Without clubbing or edema. Neurologic:  Alert and  oriented x4;  grossly normal neurologically. Skin:  Intact without significant lesions or rashes. Psych:  Alert and cooperative. Normal mood and affect.  Impression/Plan: Barbara [name] is here for a colonoscopy to be performed for colon cancer screening purposes.  The risks of the procedure including infection, bleed, or perforation as well as benefits, limitations, alternatives and imponderables have been reviewed with the patient. Questions have been answered. All parties agreeable.

## 2024-01-29 NOTE — Transfer of Care (Signed)
 Immediate Anesthesia Transfer of Care Note  Patient: Barbara [name]  Procedure(s) Performed: COLONOSCOPY  Patient Location: Endoscopy Unit  Anesthesia Type:General  Level of Consciousness: awake, alert , oriented, and patient cooperative  Airway & Oxygen Therapy: Patient Spontanous Breathing  Post-op Assessment: Report given to RN and Post -op Vital signs reviewed and stable  Post vital signs: Reviewed and stable  Last Vitals:  Vitals Value Taken Time  BP 105/78 01/29/24 10:40  Temp 36.6 C 01/29/24 10:40  Pulse 78 01/29/24 10:40  Resp 16 01/29/24 10:40  SpO2 96 % 01/29/24 10:40    Last Pain:  Vitals:   01/29/24 1040  TempSrc: Oral  PainSc: 0-No pain      Patients Stated Pain Goal: 9 (01/29/24 0949)  Complications: No notable events documented.

## 2024-01-29 NOTE — Discharge Instructions (Addendum)
  Colonoscopy Discharge Instructions  Read the instructions outlined below and refer to this sheet in the next few weeks. These discharge instructions provide you with general information on caring for yourself after you leave the hospital. Your doctor may also give you specific instructions. While your treatment has been planned according to the most current medical practices available, unavoidable complications occasionally occur.   ACTIVITY You may resume your regular activity, but move at a slower pace for the next 24 hours.  Take frequent rest periods for the next 24 hours.  Walking will help get rid of the air and reduce the bloated feeling in your belly (abdomen).  No driving for 24 hours (because of the medicine (anesthesia) used during the test).   Do not sign any important legal documents or operate any machinery for 24 hours (because of the anesthesia used during the test).  NUTRITION Drink plenty of fluids.  You may resume your normal diet as instructed by your doctor.  Begin with a light meal and progress to your normal diet. Heavy or fried foods are harder to digest and may make you feel sick to your stomach (nauseated).  Avoid alcoholic beverages for 24 hours or as instructed.  MEDICATIONS You may resume your normal medications unless your doctor tells you otherwise.  WHAT YOU CAN EXPECT TODAY Some feelings of bloating in the abdomen.  Passage of more gas than usual.  Spotting of blood in your stool or on the toilet paper.  IF YOU HAD POLYPS REMOVED DURING THE COLONOSCOPY: No aspirin products for 7 days or as instructed.  No alcohol for 7 days or as instructed.  Eat a soft diet for the next 24 hours.  FINDING OUT THE RESULTS OF YOUR TEST Not all test results are available during your visit. If your test results are not back during the visit, make an appointment with your caregiver to find out the results. Do not assume everything is normal if you have not heard from your  caregiver or the medical facility. It is important for you to follow up on all of your test results.  SEEK IMMEDIATE MEDICAL ATTENTION IF: You have more than a spotting of blood in your stool.  Your belly is swollen (abdominal distention).  You are nauseated or vomiting.  You have a temperature over 101.  You have abdominal pain or discomfort that is severe or gets worse throughout the day.   Your colonoscopy revealed 5 polyp(s) which I removed successfully. Await pathology results, my office will contact you. I recommend repeating colonoscopy in 5-7 years for surveillance purposes, depending on pathology results.  You also have diverticulosis and internal hemorrhoids. I would recommend increasing fiber in your diet or adding OTC Benefiber/Metamucil. Be sure to drink at least 4 to 6 glasses of water daily. Follow-up with GI as needed.   I hope you have a great rest of your week!  Carlin POUR. Cindie, D.O. [facility]ology and [facility]at[facility]

## 2024-01-29 NOTE — Anesthesia Preprocedure Evaluation (Signed)
 Anesthesia Evaluation  Patient identified by MRN, date of birth, ID band Patient awake    Reviewed: Allergy & Precautions, H&P , NPO status , Patient's Chart, lab work & pertinent test results, reviewed documented beta blocker date and time   Airway Mallampati: II  TM Distance: >3 FB Neck ROM: full    Dental no notable dental hx.    Pulmonary neg pulmonary ROS, Current Smoker   Pulmonary exam normal breath sounds clear to auscultation       Cardiovascular Exercise Tolerance: Good hypertension, negative cardio ROS  Rhythm:regular Rate:Normal     Neuro/Psych negative neurological ROS  negative psych ROS   GI/Hepatic negative GI ROS, Neg liver ROS,,,  Endo/Other  negative endocrine ROS    Renal/GU negative Renal ROS  negative genitourinary   Musculoskeletal   Abdominal   Peds  Hematology negative hematology ROS (+)   Anesthesia Other Findings   Reproductive/Obstetrics negative OB ROS                             Anesthesia Physical Anesthesia Plan  ASA: 2  Anesthesia Plan: General   Post-op Pain Management:    Induction:   PONV Risk Score and Plan: Propofol infusion  Airway Management Planned:   Additional Equipment:   Intra-op Plan:   Post-operative Plan:   Informed Consent: I have reviewed the patients History and Physical, chart, labs and discussed the procedure including the risks, benefits and alternatives for the proposed anesthesia with the patient or authorized representative who has indicated his/her understanding and acceptance.     Dental Advisory Given  Plan Discussed with: CRNA  Anesthesia Plan Comments:        Anesthesia Quick Evaluation

## 2024-02-01 ENCOUNTER — Encounter (HOSPITAL_COMMUNITY): Payer: Self-pay | Admitting: Internal Medicine

## 2024-02-01 LAB — SURGICAL PATHOLOGY

## 2024-02-14 ENCOUNTER — Ambulatory Visit: Payer: Self-pay | Admitting: Internal Medicine

## 2024-02-17 NOTE — Progress Notes (Signed)
 noted
